# Patient Record
Sex: Female | Born: 1935 | Race: Black or African American | Hispanic: No | Marital: Married | Smoking: Former smoker
Health system: Southern US, Community
[De-identification: ages and names within clinical notes are randomized; demographics above are authoritative.]

## PROBLEM LIST (undated history)

## (undated) DIAGNOSIS — K259 Gastric ulcer, unspecified as acute or chronic, without hemorrhage or perforation: Secondary | ICD-10-CM

## (undated) DIAGNOSIS — I4891 Unspecified atrial fibrillation: Secondary | ICD-10-CM

## (undated) DIAGNOSIS — M1990 Unspecified osteoarthritis, unspecified site: Secondary | ICD-10-CM

## (undated) DIAGNOSIS — F0390 Unspecified dementia without behavioral disturbance: Secondary | ICD-10-CM

## (undated) HISTORY — PX: HIP SURGERY: SHX245

---

## 1997-06-27 ENCOUNTER — Other Ambulatory Visit: Admission: RE | Admit: 1997-06-27 | Discharge: 1997-06-27 | Payer: Self-pay | Admitting: Nephrology

## 1998-05-30 ENCOUNTER — Ambulatory Visit (HOSPITAL_COMMUNITY): Admission: RE | Admit: 1998-05-30 | Discharge: 1998-05-30 | Payer: Self-pay | Admitting: Nephrology

## 1998-05-30 ENCOUNTER — Encounter: Payer: Self-pay | Admitting: Nephrology

## 1998-06-16 ENCOUNTER — Encounter: Admission: RE | Admit: 1998-06-16 | Discharge: 1998-06-27 | Payer: Self-pay | Admitting: Orthopedic Surgery

## 1998-07-29 ENCOUNTER — Encounter: Payer: Self-pay | Admitting: Orthopedic Surgery

## 1998-07-29 ENCOUNTER — Ambulatory Visit (HOSPITAL_COMMUNITY): Admission: RE | Admit: 1998-07-29 | Discharge: 1998-07-29 | Payer: Self-pay | Admitting: Orthopedic Surgery

## 1998-09-12 ENCOUNTER — Ambulatory Visit (HOSPITAL_COMMUNITY): Admission: RE | Admit: 1998-09-12 | Discharge: 1998-09-12 | Payer: Self-pay | Admitting: Cardiology

## 1998-09-12 ENCOUNTER — Encounter: Payer: Self-pay | Admitting: Cardiology

## 1998-10-09 ENCOUNTER — Ambulatory Visit (HOSPITAL_COMMUNITY): Admission: RE | Admit: 1998-10-09 | Discharge: 1998-10-09 | Payer: Self-pay | Admitting: Cardiology

## 1999-09-29 ENCOUNTER — Encounter: Payer: Self-pay | Admitting: Orthopedic Surgery

## 1999-10-01 ENCOUNTER — Encounter: Payer: Self-pay | Admitting: Orthopedic Surgery

## 1999-10-01 ENCOUNTER — Inpatient Hospital Stay (HOSPITAL_COMMUNITY): Admission: RE | Admit: 1999-10-01 | Discharge: 1999-10-07 | Payer: Self-pay | Admitting: Orthopedic Surgery

## 2000-09-15 ENCOUNTER — Encounter: Payer: Self-pay | Admitting: Orthopedic Surgery

## 2000-09-15 ENCOUNTER — Inpatient Hospital Stay (HOSPITAL_COMMUNITY): Admission: RE | Admit: 2000-09-15 | Discharge: 2000-09-21 | Payer: Self-pay | Admitting: Orthopedic Surgery

## 2000-09-18 ENCOUNTER — Encounter: Payer: Self-pay | Admitting: Pulmonary Disease

## 2002-03-27 ENCOUNTER — Encounter: Payer: Self-pay | Admitting: Nephrology

## 2002-03-27 ENCOUNTER — Ambulatory Visit (HOSPITAL_COMMUNITY): Admission: RE | Admit: 2002-03-27 | Discharge: 2002-03-27 | Payer: Self-pay | Admitting: Nephrology

## 2002-03-29 ENCOUNTER — Encounter: Payer: Self-pay | Admitting: Nephrology

## 2002-03-29 ENCOUNTER — Ambulatory Visit (HOSPITAL_COMMUNITY): Admission: RE | Admit: 2002-03-29 | Discharge: 2002-03-29 | Payer: Self-pay | Admitting: Nephrology

## 2002-03-30 ENCOUNTER — Encounter: Payer: Self-pay | Admitting: Nephrology

## 2002-03-30 ENCOUNTER — Ambulatory Visit (HOSPITAL_COMMUNITY): Admission: RE | Admit: 2002-03-30 | Discharge: 2002-03-30 | Payer: Self-pay | Admitting: Nephrology

## 2003-05-16 ENCOUNTER — Other Ambulatory Visit: Admission: RE | Admit: 2003-05-16 | Discharge: 2003-05-16 | Payer: Self-pay | Admitting: Nephrology

## 2006-04-14 ENCOUNTER — Other Ambulatory Visit: Admission: RE | Admit: 2006-04-14 | Discharge: 2006-04-14 | Payer: Self-pay | Admitting: Nephrology

## 2008-04-29 ENCOUNTER — Encounter: Admission: RE | Admit: 2008-04-29 | Discharge: 2008-04-29 | Payer: Self-pay | Admitting: Nephrology

## 2009-06-26 ENCOUNTER — Inpatient Hospital Stay (HOSPITAL_COMMUNITY): Admission: EM | Admit: 2009-06-26 | Discharge: 2009-06-29 | Payer: Self-pay | Admitting: Emergency Medicine

## 2009-06-27 ENCOUNTER — Encounter (INDEPENDENT_AMBULATORY_CARE_PROVIDER_SITE_OTHER): Payer: Self-pay | Admitting: Nephrology

## 2009-07-04 ENCOUNTER — Emergency Department (HOSPITAL_COMMUNITY): Admission: EM | Admit: 2009-07-04 | Discharge: 2009-07-04 | Payer: Self-pay | Admitting: Emergency Medicine

## 2010-03-29 LAB — DIFFERENTIAL
Basophils Absolute: 0 10*3/uL (ref 0.0–0.1)
Basophils Absolute: 0 10*3/uL (ref 0.0–0.1)
Eosinophils Absolute: 0 10*3/uL (ref 0.0–0.7)
Eosinophils Relative: 1 % (ref 0–5)
Eosinophils Relative: 5 % (ref 0–5)
Lymphs Abs: 0.9 10*3/uL (ref 0.7–4.0)
Monocytes Absolute: 0.4 10*3/uL (ref 0.1–1.0)
Monocytes Relative: 13 % — ABNORMAL HIGH (ref 3–12)
Monocytes Relative: 8 % (ref 3–12)
Neutro Abs: 1.1 10*3/uL — ABNORMAL LOW (ref 1.7–7.7)
Neutrophils Relative %: 38 % — ABNORMAL LOW (ref 43–77)

## 2010-03-29 LAB — COMPREHENSIVE METABOLIC PANEL
AST: 33 U/L (ref 0–37)
Alkaline Phosphatase: 191 U/L — ABNORMAL HIGH (ref 39–117)
BUN: 6 mg/dL (ref 6–23)
CO2: 25 mEq/L (ref 19–32)
Calcium: 9.4 mg/dL (ref 8.4–10.5)
GFR calc Af Amer: >60 mL/min (ref >60)
Potassium: 3.3 mEq/L — ABNORMAL LOW (ref 3.5–5.1)
Total Protein: 7.3 g/dL (ref 6.0–8.3)

## 2010-03-29 LAB — POCT CARDIAC MARKERS: Troponin i, poc: <0.05 ng/mL (ref 0.00–0.09)

## 2010-03-29 LAB — URINE CULTURE

## 2010-03-29 LAB — URINALYSIS, ROUTINE W REFLEX MICROSCOPIC
Ketones, ur: 15 mg/dL — AB
Protein, ur: 30 mg/dL — AB
Specific Gravity, Urine: 1.025 (ref 1.005–1.030)
Urobilinogen, UA: 1 mg/dL (ref 0.0–1.0)

## 2010-03-29 LAB — BASIC METABOLIC PANEL
BUN: 6 mg/dL (ref 6–23)
GFR calc Af Amer: >60 mL/min (ref >60)
Glucose, Bld: 101 mg/dL — ABNORMAL HIGH (ref 70–99)
Potassium: 3.1 mEq/L — ABNORMAL LOW (ref 3.5–5.1)

## 2010-03-29 LAB — CBC
HCT: 36.6 % (ref 36.0–46.0)
Hemoglobin: 12.5 g/dL (ref 12.0–15.0)
MCHC: 33.7 g/dL (ref 30.0–36.0)
MCHC: 33.8 g/dL (ref 30.0–36.0)
MCV: 94.5 fL (ref 78.0–100.0)
Platelets: 244 10*3/uL (ref 150–400)
RBC: 4.11 MIL/uL (ref 3.87–5.11)
RDW: 13.4 % (ref 11.5–15.5)
WBC: 3.8 10*3/uL — ABNORMAL LOW (ref 4.0–10.5)

## 2010-03-29 LAB — HEPATIC FUNCTION PANEL
AST: 27 U/L (ref 0–37)
Bilirubin, Direct: 0.2 mg/dL (ref 0.0–0.3)
Total Protein: 6.9 g/dL (ref 6.0–8.3)

## 2010-03-29 LAB — RENAL FUNCTION PANEL
CO2: 24 mEq/L (ref 19–32)
Chloride: 105 mEq/L (ref 96–112)
Creatinine, Ser: 0.86 mg/dL (ref 0.4–1.2)
GFR calc Af Amer: >60 mL/min (ref >60)
GFR calc non Af Amer: >60 mL/min (ref >60)
Glucose, Bld: 117 mg/dL — ABNORMAL HIGH (ref 70–99)
Phosphorus: 4.1 mg/dL (ref 2.3–4.6)
Potassium: 3 mEq/L — ABNORMAL LOW (ref 3.5–5.1)

## 2010-03-29 LAB — LACTIC ACID, PLASMA: Lactic Acid, Venous: 2.2 mmol/L (ref 0.5–2.2)

## 2010-03-29 LAB — APTT: aPTT: 31 seconds (ref 24–37)

## 2010-03-29 LAB — URINE MICROSCOPIC-ADD ON

## 2010-03-29 LAB — CEA: CEA: 1.5 ng/mL (ref 0.0–5.0)

## 2010-03-29 LAB — LIPASE, BLOOD: Lipase: 41 U/L (ref 11–59)

## 2010-05-29 NOTE — Op Note (Signed)
[place]. [facility]  Patient:    SYBILLA, MALHOTRA Visit Number: [id number] MRN: [id number]          Service Type: SUR Location: 3700 3729 [id number] Attending Physician:  Wende Mott Dictated by:   Kennieth Rad, M.D. Proc. Date: 09/15/00 Admit Date:  09/15/2000 Discharge Date: 09/21/2000                             Operative Report  PREOPERATIVE DIAGNOSIS:  Degenerative arthritis left hip.  POSTOPERATIVE DIAGNOSIS:  Degenerative arthritis left hip.  PROCEDURE:  Left total hip replacement using DePuy Prodigy hip system.  ANESTHESIA:  General.  DESCRIPTION OF PROCEDURE:  The patient was taken to the operating room after giving adequate IV preoperative medications, given anesthesia and intubated. The patient placed in left lateral position.  Left hip was prepped with DuraPrep and draped in a sterile manner.  Bovie was used for hemostasis. Posterior silent approach was made of the left hip going through skin, subcutaneous tissue, down through the fascia lata.  The short external rotator was identified and released.  The capsule was identified and resected.  It was dislocated.  Femoral head cutting jig was put in place, and the femoral head was removed.  Attention was then turned back to the acetabulum.  Reaming of the acetabulum was then done, incising the acetabulum was done and measurement for a 28 mm x 48 mm acetabular shell Press-Fit.  Trial component was put in place and was found to be very good and stable, very good type and fit.  Next, attention was turned to the femur.  Reaming was done down the femur, followed by use of the rasp and sizing the femoral component with that of an 18 mm x 170 mm length stem component.  Copious irrigation was done with antibiotic solution.  Trial components were used and put in place.  The 28 plus 1.5 head, 48 mm x 28 mm polyethylene with a 10-degree lip, 48 mm shell, and a femoral stem 18 mm x 170 mm  length stem.  Trial components were put in place and were found to be good and stable.  Full range of motion, good stability, good leg length.  Trial components were removed and the final components were used with an addition of the apex hole eliminator and put in place into the acetabular shell.  Impaction of the stem was done down the femoral canal and placement of the acetabular components was found to be good and stable.  Again, range of motion was tested and was found to be good and stable.  Copious irrigation with antibiotic solution was then done, followed by wound close using 0-Vicryl for the fascia, 2-0 for the subcutaneous, and skin staples for the skin. Compressive dressing was applied.  The patient was placed in a hip abduction pillow and went to the recovery room in stable and satisfactory condition. Dictated by:   Kennieth Rad, M.D. Attending Physician:  Wende Mott DD:  10/18/00 TD:  10/18/00 Job: [id number]

## 2010-05-29 NOTE — H&P (Signed)
[place]. [facility]  Patient:    Alexis Gregory, Alexis Gregory Visit Number: [id number] MRN: [id number]          Service Type: SUR Location: 3700 3729 02 Attending Physician:  Wende Mott Dictated by:   Kennieth Rad, M.D. Admit Date:  09/15/2000 Discharge Date: 09/21/2000                           History and Physical  CHIEF COMPLAINT:  Painful left hip.  HISTORY OF PRESENT ILLNESS:  This is a 77 year old black female who has been followed in the office for severe degenerative joint disease involving the left hip.  The patients pain became more pronounced and aggravated with simply getting up and trying to walk.  Pain at rest as well.  The patient has been taking anti-inflammatories and pain pills.  The patient uses a cane for ambulation.  PAST MEDICAL HISTORY: 1. Right total hip arthroplasty in 2001. 2. History of hysterectomy. 3. History of appendectomy.  No history of high blood pressure or diabetes.  ALLERGIES:  E-MYCIN.  MEDICATIONS:  Celebrex, Ultracet, and Flexeril.  HABITS:  None.  FAMILY HISTORY:  Noncontributory.  REVIEW OF SYSTEMS:  Basically that of History of Present Illness.  No cardiac or respiratory, no urinary or bowel symptoms.  PHYSICAL EXAMINATION:  VITAL SIGNS:  Temperature 97.9, pulse 76, respirations 16, blood pressure 140/80.  Height 5 feet 3 inches, weight 160 pounds.  HEENT:  Normocephalic.  ______ and sclerae clear.  NECK:  Supple.  CHEST:  Clear.  CARDIAC:  S1, S2 regular.  EXTREMITIES:  Left hip markedly tender with pain anterior and laterally.  Pain on flexion and rotation of the hip.  Antalgic gait with limp on the left side. Ambulating with a cane.  LABORATORY DATA:  X-rays reveal severe degenerative joint disease involving the left hip.  IMPRESSION:  Degenerative joint disease left hip. Dictated by:   Kennieth Rad, M.D. Attending Physician:  Wende Mott DD:  10/18/00 TD:   10/18/00 Job: [id number]

## 2010-05-29 NOTE — Op Note (Signed)
[place]. [facility]  Patient:    Alexis Gregory, Alexis Gregory                    MRN: [id number] Proc. Date: 10/01/99 Adm. Date:  [id number] Disc. Date: [id number] Attending:  Wende Mott                           Operative Report  PREOPERATIVE DIAGNOSIS:  Severe degenerative joint disease, right hip, with protrusio.  POSTOPERATIVE DIAGNOSIS:  Severe degenerative joint disease, right hip, with protrusio.  PROCEDURE:  Right total hip arthroplasty with bone graft to the right acetabulum.  DESCRIPTION OF PROCEDURE:  The patient was taken to the operating room after being given preoperative medication and given general anesthesia and intubated.  The patient was placed in the right lateral position.   The right hip was prepped with Duraprep and draped in a sterile manner.  Bovie used for hemostasis.  A posterior southern approach was made over the right hip, down through the skin and subcutaneous tissue, down through the gluteal muscle. The incision was down to the fascia lata.  The hip was internally rotated. Short rotators were identified and released.  Hemostasis obtained with the Bovie.  The capsule was identified and resected.  The femoral head was identified, and the acetabulum.  The femoral head had to be removed due to the hip not being able to be dislocated.  The cutting jig was put in place for the femoral neck cut, after which the femoral head was then able to be removed. Several cystic lesions were found in the acetabulum.  These were resected with the use of curette.  Complete capsulectomy was done.  The femoral head was denuded of any cartilage and was ground for bone graft.  Reaming of the acetabulum was done up to a 48 mm shell after adequate reaming was done down to good bleeding bone and tissue.  Trial implant was put in place and was found to be a very good, snug fit, and very stable.  A 10 degree trial acetabular liner was then put in  place.  Next, attention was turned to the proximal femur.  With use of a cookie cutter, reaming was started down to the neck and femoral canal.  The femoral canal was reamed and then with use of a rasp, fitted up to a 19.5 mm stem.  The trial femoral component was put in place with a 1.5 head and neck component and was found to be good and stable _____ open with flexion, extension, internal and external rotation.  The patient also had good leg length.  Next, the trials were removed, the bone graft was impacted into the acetabulum and the defects, and with the reamer put in reverse, the graft was then packed.  The acetabular shell 48 mm was then impacted into the acetabulum, found to be very good and stable.  Two cancellous bone screws were still used, as well as the apex hole screw.  Next, the 10 degree, 28 mm liner was then snapped in place and the stem itself, femoral stem component, was 19.5, with the femoral head cobalt chrome 28 x 1.5 mm.  After impacting the femoral components and the head on, again the hip was reduced.  Very good range of motion, good length, very good stability. Copious antibiotic irrigation was done with antibiotic solution.  Wound closure was then done with the use of 0 Vicryl for  the fascia, 2-0 for the subcutaneous tissue, and skin staples on the skin.  A compressive dressing was applied.  The patient tolerated the procedure quite well.  A hip abduction pillow was put in place, and patient went to the recovery room in stable and satisfactory condition. DD:  10/20/99 TD:  10/20/99 Job: [id number]

## 2010-05-29 NOTE — Discharge Summary (Signed)
[place]. [facility]  Patient:    Alexis Gregory, Alexis Gregory Visit Number: [id number] MRN: [id number]          Service Type: SUR Location: 3700 3729 02 Attending Physician:  Wende Mott Dictated by:   Kennieth Rad, M.D. Admit Date:  09/15/2000 Discharge Date: 09/21/2000                             Discharge Summary  ADMISSION DIAGNOSIS:  Severe degenerative joint disease left hip.  DISCHARGE DIAGNOSIS:  Severe degenerative joint disease left hip.  OPERATIONS:  Left total knee arthroplasty.  COMPLICATIONS:  None.  INFECTIONS:  None.  PERTINENT HISTORY:  This is a 77 year old female with significant degenerative joint disease followed in the office with progressive pain, disability from her joint problem.  The patients pertinent physical regarded the left hip with limited range of motion, tender, with pain on ambulation as well as bed rest.  HOSPITAL COURSE:  The patient underwent preoperative medical evaluation and was found to be stable for surgery by Dr. Jeri Cos.  The patients preoperative laboratory was done and was also found to be stable.  The patient underwent the surgery, left total hip arthroplasty, on September 15, 2000. Tolerated the procedure quite well.  Postoperative course fairly benign.  The patient did have an episode of irregular heart rhythm and was seen by cardiologist and found to be an old problem.  The patient had had a history of having some arrhythmia before.  The patient was placed on some medication for cardiac arrhythmia, stabilized, and the patient progressed with physical therapy quite well with partial weightbearing on the left side.  Able to be discharged with continued Coumadin per pharmacy, Percocet for pain.  Use of walker.  Return to the office 10 days after discharge.  The patient is discharged in stable and satisfactory condition. Dictated by:   Kennieth Rad, M.D. Attending Physician:  Wende Mott DD:  10/18/00 TD:  10/18/00 Job: [id number]

## 2010-05-29 NOTE — Discharge Summary (Signed)
[place]. [facility]  Patient:    Alexis Gregory, Alexis Gregory                    MRN: [id number] Adm. Date:  [id number] Disc. Date: [id number] Attending:  Wende Mott                           Discharge Summary  ADMITTING DIAGNOSIS:  Severe degenerative arthritis, right hip, with protrusio.  DISCHARGE DIAGNOSIS:  Severe degenerative arthritis, right hip, with protrusio.  COMPLICATIONS:  None.  INFECTIONS:  None.  OPERATION:  Right total hip arthroplasty with bone grafting.  HISTORY OF PRESENT ILLNESS:  This is a 77 year old who had been followed in the office for the past two years with severe degenerative joint disease of the right hip.  The patient had undergone use of a cane and walker, with pain at rest as well as activity.  PHYSICAL EXAMINATION:  Pertinent physical was at the right hip:  Ankylotic with limited rotation as well as abduction.  Palpable and audible crepitus. Marked antalgic gait.  HOSPITAL COURSE:  The patient had preoperative laboratory done, which was found to be stable enough for the patient to undergo surgery.  The patient also had preoperative cardiac exam by Dr. Verdis Prime and medical exam per Dr. Jeri Cos.  The patient underwent right total hip arthroplasty on October 01, 1999.  She tolerated the procedure quite well.  Postoperative course was fairly benign.  The patient does live alone, so her function would have to be to the point of her being able to care for herself.  The patient did quite well in physical therapy and became stable enough to be discharged and discharged and continued on Coumadin x 2 weeks, Feosol 300 mg b.i.d., Senokot S at bedtime.  The patient is to use Ultram one to two q.4h. p.r.n. for pain and Coumadin per pharmacy and return to the office in 10 days.  DISPOSITION:  Patient discharged in stable and satisfactory condition.DD: 10/20/99 TD:  10/20/99 Job: [id number]

## 2010-05-29 NOTE — H&P (Signed)
[place]. [facility]  Patient:    Alexis Gregory, Alexis Gregory                    MRN: [id number] Adm. Date:  [id number] Disc. Date: [id number] Attending:  Wende Mott                         History and Physical  CHIEF COMPLAINT:  Painful right hip.  HISTORY OF PRESENT ILLNESS:  This is a 77 year old female who has been followed for severe degenerative joint disease involving the right hip for the past two years.  More recently the patients symptoms in the right hip have progressively gotten worse, with pain at rest as well as on ambulation.  The patient had been scheduled before but had a cardiac arrhythmia, and she had to undergo cardiac evaluation.  The patient has been using a cane and anti-inflammatories and pain medication.  PAST MEDICAL HISTORY:  Appendectomy and hysterectomy in the 1960s.  The patient does have history of degenerative arthritis, cardiac arrhythmia, tachycardia.  No history of high blood pressure or diabetes.  ALLERGIES:  ERYTHROMYCIN.  MEDICATIONS:  Toprol XL 50 mg, Celebrex, Skelaxin, vitamin E, Ultram, vitamin C 500 mg, vitamin E 400 units, calcium with vitamin D, and coated aspirin.  HABITS:  None.  FAMILY HISTORY:  Noncontributory.  REVIEW OF SYSTEMS:  No recent cardiac or respiratory or urinary symptoms or bowel symptoms.  PHYSICAL EXAMINATION:  VITAL SIGNS:  Temperature 97.2, pulse 60, respirations 18, blood pressure 120/70.  Height 5 feet 4 inches, weight 116.  GENERAL:  Alert and oriented, in no acute distress.  The patient does walk with an antalgic gait with an obvious limp on the right side.  The patient does have some leg length discrepancy with shortening on the right side.  HEENT:  Normocephalic.  Eyes:  Conjunctivae and sclerae clear.  NECK:  Supple.  CHEST:  Clear.  CARDIAC:  S1, S2, regular.  EXTREMITIES:  Right hip ankylotic with minimal rotation, hip flexion up to 90 degrees, minimal abduction of  the right hip.  Tender with palpable and audible crepitus of the joint.  IMPRESSION:  Severe degenerative joint disease, right hip, with cystic changes and protrusio. DD:  10/20/99 TD:  10/20/99 Job: [id number]

## 2010-11-12 ENCOUNTER — Ambulatory Visit
Admission: RE | Admit: 2010-11-12 | Discharge: 2010-11-12 | Disposition: A | Discharge status: Home / Self Care | Payer: PRIVATE HEALTH INSURANCE | Source: Ambulatory Visit | Attending: Nephrology | Admitting: Nephrology

## 2010-11-12 ENCOUNTER — Other Ambulatory Visit: Payer: Self-pay | Admitting: Nephrology

## 2010-11-12 DIAGNOSIS — R05 Cough: Secondary | ICD-10-CM

## 2013-03-12 ENCOUNTER — Ambulatory Visit: Payer: Self-pay | Admitting: Podiatry

## 2013-03-21 ENCOUNTER — Ambulatory Visit (INDEPENDENT_AMBULATORY_CARE_PROVIDER_SITE_OTHER): Payer: Medicare Other | Admitting: Podiatry

## 2013-03-21 ENCOUNTER — Encounter: Payer: Self-pay | Admitting: Podiatry

## 2013-03-21 VITALS — Ht 59.0 in | Wt 150.0 lb

## 2013-03-21 DIAGNOSIS — B351 Tinea unguium: Secondary | ICD-10-CM

## 2013-03-21 DIAGNOSIS — M79609 Pain in unspecified limb: Secondary | ICD-10-CM

## 2013-03-21 NOTE — Progress Notes (Signed)
   Subjective:    Patient ID: Alexis Gregory, female    DOB: 1921-05-05, 80 y.o.   MRN: [id number]  HPI Comments: "Trim her nails and some of them hurt"  Patient daughter in with pt. Needs her toenails trimmed. Some are dark and thick. Some are painful.     Review of Systems     Objective:   Physical Exam        Assessment & Plan:

## 2013-03-22 NOTE — Progress Notes (Signed)
Subjective:     Patient ID: Alexis Gregory, female   DOB: 09-Dec-1921, 80 y.o.   MRN: [id number]  HPI patient presents with nail disease 1-5 both feet that are thick and irritated   Review of Systems     Objective:   Physical Exam Thick nail disease 1-5 both feet that are painful when I palpate    Assessment:     Mycotic nail infection with pain 1-5 both feet    Plan:     Debridement painful nail bed 1-5 both feet with no bleeding noted

## 2013-03-26 ENCOUNTER — Telehealth: Payer: Self-pay | Admitting: *Deleted

## 2013-03-26 ENCOUNTER — Other Ambulatory Visit: Payer: Self-pay | Admitting: *Deleted

## 2013-03-26 MED ORDER — CEPHALEXIN 500 MG PO CAPS: 500.0000 mg | ORAL_CAPSULE | Freq: Two times a day (BID) | ORAL | Status: DC

## 2013-03-26 NOTE — Telephone Encounter (Signed)
This RN spoke with pt per her call stating onset over the weekend of " boil on upper lip of vagina ".  Boil is very sore and interferes with ADL's ( walking- etc ).  Dorann LodgeJuanita has been " sitting in hot baths and that has helped some but I was hoping I could get an antibiotic to help ".  Per call this RN informed pt above would be reviewed with AB/PA due to MD out of the office and call returned to her.

## 2013-05-15 ENCOUNTER — Other Ambulatory Visit: Payer: Self-pay | Admitting: *Deleted

## 2013-06-21 ENCOUNTER — Ambulatory Visit: Payer: Medicare Other | Admitting: Podiatry

## 2014-04-29 ENCOUNTER — Observation Stay (HOSPITAL_COMMUNITY): Payer: Medicare Other

## 2014-04-29 ENCOUNTER — Inpatient Hospital Stay (HOSPITAL_COMMUNITY): Payer: Medicare Other

## 2014-04-29 ENCOUNTER — Inpatient Hospital Stay (HOSPITAL_COMMUNITY)
Admission: EM | Admit: 2014-04-29 | Discharge: 2014-05-02 | DRG: 871 | Disposition: A | Discharge status: Home / Self Care | Payer: Medicare Other | Attending: Internal Medicine | Admitting: Internal Medicine

## 2014-04-29 ENCOUNTER — Emergency Department (HOSPITAL_COMMUNITY): Payer: Medicare Other

## 2014-04-29 ENCOUNTER — Encounter (HOSPITAL_COMMUNITY): Payer: Self-pay

## 2014-04-29 DIAGNOSIS — M1990 Unspecified osteoarthritis, unspecified site: Secondary | ICD-10-CM | POA: Diagnosis present

## 2014-04-29 DIAGNOSIS — G9341 Metabolic encephalopathy: Secondary | ICD-10-CM | POA: Diagnosis present

## 2014-04-29 DIAGNOSIS — G9340 Encephalopathy, unspecified: Secondary | ICD-10-CM | POA: Diagnosis not present

## 2014-04-29 DIAGNOSIS — I480 Paroxysmal atrial fibrillation: Secondary | ICD-10-CM | POA: Diagnosis not present

## 2014-04-29 DIAGNOSIS — R4182 Altered mental status, unspecified: Secondary | ICD-10-CM | POA: Diagnosis present

## 2014-04-29 DIAGNOSIS — D72819 Decreased white blood cell count, unspecified: Secondary | ICD-10-CM | POA: Diagnosis present

## 2014-04-29 DIAGNOSIS — F0390 Unspecified dementia without behavioral disturbance: Secondary | ICD-10-CM | POA: Diagnosis present

## 2014-04-29 DIAGNOSIS — Z79899 Other long term (current) drug therapy: Secondary | ICD-10-CM

## 2014-04-29 DIAGNOSIS — A419 Sepsis, unspecified organism: Principal | ICD-10-CM | POA: Diagnosis present

## 2014-04-29 DIAGNOSIS — N390 Urinary tract infection, site not specified: Secondary | ICD-10-CM | POA: Diagnosis present

## 2014-04-29 DIAGNOSIS — R55 Syncope and collapse: Secondary | ICD-10-CM | POA: Diagnosis present

## 2014-04-29 DIAGNOSIS — Z87891 Personal history of nicotine dependence: Secondary | ICD-10-CM | POA: Diagnosis not present

## 2014-04-29 HISTORY — DX: Unspecified osteoarthritis, unspecified site: M19.90

## 2014-04-29 HISTORY — DX: Gastric ulcer, unspecified as acute or chronic, without hemorrhage or perforation: K25.9

## 2014-04-29 HISTORY — DX: Unspecified dementia, unspecified severity, without behavioral disturbance, psychotic disturbance, mood disturbance, and anxiety: F03.90

## 2014-04-29 LAB — CBC WITH DIFFERENTIAL/PLATELET
BASOS PCT: 0 % (ref 0–1)
Basophils Absolute: 0 10*3/uL (ref 0.0–0.1)
EOS ABS: 0.1 10*3/uL (ref 0.0–0.7)
Eosinophils Relative: 3 % (ref 0–5)
HCT: 36.5 % (ref 36.0–46.0)
Hemoglobin: 11.7 g/dL — ABNORMAL LOW (ref 12.0–15.0)
LYMPHS ABS: 2 10*3/uL (ref 0.7–4.0)
Lymphocytes Relative: 59 % — ABNORMAL HIGH (ref 12–46)
MCH: 30.2 pg (ref 26.0–34.0)
MCHC: 32.1 g/dL (ref 30.0–36.0)
MCV: 94.3 fL (ref 78.0–100.0)
MONOS PCT: 7 % (ref 3–12)
Monocytes Absolute: 0.2 10*3/uL (ref 0.1–1.0)
Neutro Abs: 1 10*3/uL — ABNORMAL LOW (ref 1.7–7.7)
Neutrophils Relative %: 31 % — ABNORMAL LOW (ref 43–77)
Platelets: 217 10*3/uL (ref 150–400)
RBC: 3.87 MIL/uL (ref 3.87–5.11)
RDW: 13.3 % (ref 11.5–15.5)
WBC: 3.3 10*3/uL — AB (ref 4.0–10.5)

## 2014-04-29 LAB — URINALYSIS, ROUTINE W REFLEX MICROSCOPIC
Bilirubin Urine: NEGATIVE
Glucose, UA: NEGATIVE mg/dL
Hgb urine dipstick: NEGATIVE
KETONES UR: NEGATIVE mg/dL
NITRITE: NEGATIVE
Protein, ur: NEGATIVE mg/dL
Specific Gravity, Urine: 1.027 (ref 1.005–1.030)
UROBILINOGEN UA: 0.2 mg/dL (ref 0.0–1.0)
pH: 5 (ref 5.0–8.0)

## 2014-04-29 LAB — CBC
HCT: 33.4 % — ABNORMAL LOW (ref 36.0–46.0)
Hemoglobin: 11.1 g/dL — ABNORMAL LOW (ref 12.0–15.0)
MCH: 31.2 pg (ref 26.0–34.0)
MCHC: 33.2 g/dL (ref 30.0–36.0)
MCV: 93.8 fL (ref 78.0–100.0)
Platelets: 196 10*3/uL (ref 150–400)
RBC: 3.56 MIL/uL — ABNORMAL LOW (ref 3.87–5.11)
RDW: 13.4 % (ref 11.5–15.5)
WBC: 2.7 10*3/uL — ABNORMAL LOW (ref 4.0–10.5)

## 2014-04-29 LAB — TROPONIN I: Troponin I: <0.03 ng/mL (ref <0.031)

## 2014-04-29 LAB — COMPREHENSIVE METABOLIC PANEL
ALK PHOS: 119 U/L — AB (ref 39–117)
ALT: 13 U/L (ref 0–35)
AST: 25 U/L (ref 0–37)
Albumin: 3.9 g/dL (ref 3.5–5.2)
Anion gap: 8 (ref 5–15)
BILIRUBIN TOTAL: 0.5 mg/dL (ref 0.3–1.2)
BUN: 13 mg/dL (ref 6–23)
CALCIUM: 9 mg/dL (ref 8.4–10.5)
CO2: 25 mmol/L (ref 19–32)
Chloride: 105 mmol/L (ref 96–112)
Creatinine, Ser: 0.83 mg/dL (ref 0.50–1.10)
GFR calc non Af Amer: 60 mL/min — ABNORMAL LOW (ref >90)
GFR, EST AFRICAN AMERICAN: 70 mL/min — AB (ref >90)
Glucose, Bld: 107 mg/dL — ABNORMAL HIGH (ref 70–99)
POTASSIUM: 3.6 mmol/L (ref 3.5–5.1)
Sodium: 138 mmol/L (ref 135–145)
Total Protein: 7.4 g/dL (ref 6.0–8.3)

## 2014-04-29 LAB — CREATININE, SERUM
CREATININE: 0.75 mg/dL (ref 0.50–1.10)
GFR calc Af Amer: 84 mL/min — ABNORMAL LOW (ref >90)
GFR calc non Af Amer: 72 mL/min — ABNORMAL LOW (ref >90)

## 2014-04-29 LAB — CBG MONITORING, ED: Glucose-Capillary: 107 mg/dL — ABNORMAL HIGH (ref 70–99)

## 2014-04-29 LAB — URINE MICROSCOPIC-ADD ON

## 2014-04-29 LAB — POC OCCULT BLOOD, ED: FECAL OCCULT BLD: NEGATIVE

## 2014-04-29 LAB — I-STAT CG4 LACTIC ACID, ED: Lactic Acid, Venous: 0.65 mmol/L (ref 0.5–2.0)

## 2014-04-29 MED ORDER — VITAMIN D 1000 UNITS PO TABS
1000.0000 [IU] | ORAL_TABLET | Freq: Every day | ORAL | Status: DC
  Administered 2014-04-30 – 2014-05-02 (×3): 1000 [IU] via ORAL
  Filled 2014-04-29 (×3): qty 1

## 2014-04-29 MED ORDER — SODIUM CHLORIDE 0.9 % IV SOLN: INTRAVENOUS | Status: AC

## 2014-04-29 MED ORDER — SODIUM CHLORIDE 0.9 % IV BOLUS (SEPSIS)
500.0000 mL | Freq: Once | INTRAVENOUS | Status: AC
  Administered 2014-04-29: 500 mL via INTRAVENOUS

## 2014-04-29 MED ORDER — DEXTROSE 5 % IV SOLN
1.0000 g | INTRAVENOUS | Status: DC
  Administered 2014-04-30 – 2014-05-02 (×3): 1 g via INTRAVENOUS
  Filled 2014-04-29 (×3): qty 10

## 2014-04-29 MED ORDER — ALBUTEROL SULFATE (2.5 MG/3ML) 0.083% IN NEBU: 2.5000 mg | INHALATION_SOLUTION | RESPIRATORY_TRACT | Status: DC | PRN

## 2014-04-29 MED ORDER — SODIUM CHLORIDE 0.9 % IV BOLUS (SEPSIS): 2000.0000 mL | Freq: Once | INTRAVENOUS | Status: DC

## 2014-04-29 MED ORDER — DEXTROSE 5 % IV SOLN
1.0000 g | Freq: Once | INTRAVENOUS | Status: AC
  Administered 2014-04-29: 1 g via INTRAVENOUS
  Filled 2014-04-29: qty 10

## 2014-04-29 MED ORDER — MEMANTINE HCL 10 MG PO TABS
10.0000 mg | ORAL_TABLET | Freq: Every day | ORAL | Status: DC
  Administered 2014-04-30 – 2014-05-02 (×3): 10 mg via ORAL
  Filled 2014-04-29 (×3): qty 1

## 2014-04-29 MED ORDER — HEPARIN SODIUM (PORCINE) 5000 UNIT/ML IJ SOLN
5000.0000 [IU] | Freq: Three times a day (TID) | INTRAMUSCULAR | Status: DC
  Administered 2014-04-29 – 2014-05-02 (×8): 5000 [IU] via SUBCUTANEOUS
  Filled 2014-04-29 (×8): qty 1

## 2014-04-29 MED ORDER — ACETAMINOPHEN 325 MG PO TABS: 650.0000 mg | ORAL_TABLET | Freq: Four times a day (QID) | ORAL | Status: DC | PRN

## 2014-04-29 MED ORDER — ACETAMINOPHEN 650 MG RE SUPP: 650.0000 mg | Freq: Four times a day (QID) | RECTAL | Status: DC | PRN

## 2014-04-29 MED ORDER — ONDANSETRON HCL 4 MG/2ML IJ SOLN: 4.0000 mg | Freq: Four times a day (QID) | INTRAMUSCULAR | Status: DC | PRN

## 2014-04-29 MED ORDER — ONDANSETRON HCL 4 MG PO TABS: 4.0000 mg | ORAL_TABLET | Freq: Four times a day (QID) | ORAL | Status: DC | PRN

## 2014-04-29 NOTE — ED Notes (Signed)
Bed: WA06 Expected date:  Expected time:  Means of arrival:  Comments: EMS- elderly, weakness

## 2014-04-29 NOTE — H&P (Signed)
Patient Demographics  Alexis Gregory, is a 81 y.o. female  MRN: [id number]   DOB - Jun 30, 1921  Admit Date - 04/29/2014  Outpatient Primary MD for the patient is Gwynneth Aliment, MD   With History of -  Past Medical History  Diagnosis Date  . Dementia   . Gastric ulcer   . Arthritis       Past Surgical History  Procedure Laterality Date  . Hip surgery      rt/left    in for   Chief Complaint  Patient presents with  . Loss of Consciousness     HPI  Alexis Gregory  is a 81 y.o. female, with past medical history of advanced dementia, lives with her daughter, baseline confused, ambulatory with a cane, communicative, brought by her daughter today for episodes of syncope, patient reports her mom has not been feeling well over the last few hours, with decreased appetite, more lethargy, she noticed her having an episode of 3-4 minutes of unresponsiveness while she was sitting on a chair, she slumped backwards, was unresponsive, then she regained consciousness, but remains confused, in ED patient was noticed to be mildly hypothermic at 96.4, with white blood cell count of 3.3, workup was significant for UTI, patient was started on Rocephin, patient can't provide any review of systems or history, was obtained from her daughter, daughter did not noticed any facial droop, or any focal deficits, denies her mom having any fever or chills, nausea or vomiting, no diarrhea.    Review of Systems    Can't obtain any review of systems given patient's altered mental status.   Social History History  Substance Use Topics  . Smoking status: Former Games developer  . Smokeless tobacco: Not on file  . Alcohol Use: No     Family History Significant for hypertension in daughter.  Prior to Admission medications   Medication Sig Start Date End Date Taking? Authorizing Provider  acetaminophen (TYLENOL) 650 MG CR tablet Take 650 mg by mouth daily as needed for pain.   Yes Historical Provider,  MD  cholecalciferol (VITAMIN D) 1000 UNITS tablet Take 1,000 Units by mouth daily.   Yes Historical Provider, MD  memantine (NAMENDA) 10 MG tablet Take 10 mg by mouth daily. 03/08/14  Yes Historical Provider, MD  traMADol (ULTRAM) 50 MG tablet Take 50 mg by mouth daily as needed for moderate pain.  03/26/14  Yes Historical Provider, MD    No Known Allergies  Physical Exam  Vitals  Blood pressure 131/70, pulse 86, temperature 96.4 F (35.8 C), temperature source Rectal, resp. rate 20, height  (1.549 m), weight 70.308 kg (155 lb), SpO2 100 %.   1. General elderly female lying in bed in NAD,    2. Patient is confused, not answering question, respond to loud verbal stimuli, does not follow command.  3. Unable to perform focused physical exam given patient noncompliance, appears to be moving all her extremities.  4. Ears and Eyes appear Normal, Conjunctivae clear, PERRLA. Moist Oral Mucosa.  5. Supple Neck, No JVD, No cervical lymphadenopathy appriciated, No Carotid Bruits.  6. Symmetrical Chest wall movement, Good air movement bilaterally, CTAB.  7. RRR, No Gallops, Rubs or Murmurs, No Parasternal Heave.  8. Positive Bowel Sounds, Abdomen Soft, No tenderness, No organomegaly appriciated,No rebound -guarding or rigidity.  9.  No Cyanosis, Normal Skin Turgor, No Skin Rash or Bruise.  10.   joints appear normal , no effusions, Normal ROM. Mild pitting edema bilaterally  11. No Palpable Lymph Nodes in Neck or Axillae    Data Review  CBC  Recent Labs Lab 04/29/14 1350  WBC 3.3*  HGB 11.7*  HCT 36.5  PLT 217  MCV 94.3  MCH 30.2  MCHC 32.1  RDW 13.3  LYMPHSABS 2.0  MONOABS 0.2  EOSABS 0.1  BASOSABS 0.0   ------------------------------------------------------------------------------------------------------------------  Chemistries   Recent Labs Lab 04/29/14 1350  NA 138  K 3.6  CL 105  CO2 25  GLUCOSE 107*  BUN 13  CREATININE 0.83  CALCIUM 9.0  AST 25   ALT 13  ALKPHOS 119*  BILITOT 0.5   ------------------------------------------------------------------------------------------------------------------ estimated creatinine clearance is 40.4 mL/min (by C-G formula based on Cr of 0.83). ------------------------------------------------------------------------------------------------------------------ No results for input(s): TSH, T4TOTAL, T3FREE, THYROIDAB in the last 72 hours.  Invalid input(s): FREET3   Coagulation profile No results for input(s): INR, PROTIME in the last 168 hours. ------------------------------------------------------------------------------------------------------------------- No results for input(s): DDIMER in the last 72 hours. -------------------------------------------------------------------------------------------------------------------  Cardiac Enzymes No results for input(s): CKMB, TROPONINI, MYOGLOBIN in the last 168 hours.  Invalid input(s): CK ------------------------------------------------------------------------------------------------------------------ Invalid input(s): POCBNP   ---------------------------------------------------------------------------------------------------------------  Urinalysis    Component Value Date/Time   COLORURINE YELLOW 04/29/2014 1439   APPEARANCEUR CLOUDY* 04/29/2014 1439   LABSPEC 1.027 04/29/2014 1439   PHURINE 5.0 04/29/2014 1439   GLUCOSEU NEGATIVE 04/29/2014 1439   HGBUR NEGATIVE 04/29/2014 1439   BILIRUBINUR NEGATIVE 04/29/2014 1439   KETONESUR NEGATIVE 04/29/2014 1439   PROTEINUR NEGATIVE 04/29/2014 1439   UROBILINOGEN 0.2 04/29/2014 1439   NITRITE NEGATIVE 04/29/2014 1439   LEUKOCYTESUR MODERATE* 04/29/2014 1439    ----------------------------------------------------------------------------------------------------------------  Imaging results:   Dg Chest Port 1 View  04/29/2014   CLINICAL DATA:  Shortness of breath, altered mental status.   EXAM: PORTABLE CHEST - 1 VIEW  COMPARISON:  November 12, 2010.  FINDINGS: Stable cardiomediastinal silhouette. Hypoinflation of the lungs is noted. No pneumothorax or significant pleural effusion is noted. Degenerative change of the right glenohumeral joint is noted. Mild left basilar subsegmental atelectasis is noted. No significant abnormality seen in the right lung.  IMPRESSION: Hypoinflation of the lungs is noted. Mild left basilar subsegmental atelectasis is noted.   Electronically Signed   By: Lupita Raider, M.D.   On: 04/29/2014 14:24        Assessment & Plan  Active Problems:   Syncope   Acute encephalopathy   Dementia   Syncope/acute encephalopathy - This appears to be more in the setting of metabolic encephalopathy, most likely related to UTI infection, but given her presentation of cellulitis of consciousness, will admit to telemetry unit, will cycle cardiac enzymes, will check 2-D echo, will check CT head without contrast. - We'll keep nothing by mouth pending SLP evaluation. - We'll keep on gentle hydration - We will treat her UTI - We'll consult PT  UTI - Continue we'll start on Rocephin, follow on urine culture.  Dementia - Continue with Namenda    DVT Prophylaxis Heparin -   AM Labs Ordered, also please review Full Orders  Family Communication: Admission, patients condition and plan of care including tests being ordered have been discussed with the daughter who indicate understanding and agree with the plan and Code Status.  Code Status Full  Likely DC to  home when stable, will consult PT  Condition GUARDED    Time spent in minutes : 50 minutes    ELGERGAWY, DAWOOD M.D on 04/29/2014 at 4:13 PM  Between 7am to 7pm - Pager -  [phone]  After 7pm go to [email or web address] - password [email or web address]  And look for the night coverage person covering me after hours  [facility] Office  [phone]   **Disclaimer: This note may have been dictated with  voice recognition software. Similar sounding words can inadvertently be transcribed and this note may contain transcription errors which may not have been corrected upon publication of note.**

## 2014-04-29 NOTE — ED Notes (Signed)
MD at bedside. EDP J AT [facility] CANCEL OF TRANSFER TO [facility]. PT WILL STAY TO [place]. CHANGE IN BED ASSIGNMENT. LISA UNIT SECT MADE AWARE. PT AND FAMILY MADE AWARE. ADMITTING MD TO MAKE THIS CHANGE. CAKELINK CALLED AND CANCELLED

## 2014-04-29 NOTE — ED Provider Notes (Signed)
CSN: [id number]     Arrival date & time 04/29/14  1258 History   First MD Initiated Contact with Patient 04/29/14 1322     Chief Complaint  Patient presents with  . Loss of Consciousness     (Consider location/radiation/quality/duration/timing/severity/associated sxs/prior Treatment) HPI Level V caveat dementia history is obtained from patient's daughter. Patient was sitting in her home 11:50 AM today when she suffered loss of consciousness lasting several seconds. With diminished breathing. EMS was called. No treatment prior to coming here. Patient regained consciousness spontaneously. She is presently asymptomatic except "feels sleepy" no other associated symptoms. Past Medical History  Diagnosis Date  . Dementia    No past surgical history on file. No family history on file. History  Substance Use Topics  . Smoking status: Unknown If Ever Smoked  . Smokeless tobacco: Not on file  . Alcohol Use: Not on file   OB History    No data available     Review of Systems  Unable to perform ROS  unable to perform complete review of systems patient demented     Allergies  Review of patient's allergies indicates no known allergies.  Home Medications   Prior to Admission medications   Medication Sig Start Date End Date Taking? Authorizing Provider  NON FORMULARY Equate sleep aid and Tylenol    Historical Provider, MD   SpO2 95% Physical Exam  Constitutional: She appears well-developed and well-nourished. No distress.  HENT:  Head: Normocephalic and atraumatic.  Opens eyes to verbal stimulus  Eyes: Conjunctivae are normal. Pupils are equal, round, and reactive to light.  Pupils 3 mm bilaterally reactive to light  Neck: Neck supple. No tracheal deviation present. No thyromegaly present.  Cardiovascular: Normal rate and regular rhythm.   No murmur heard. Pulmonary/Chest: Effort normal and breath sounds normal.  Abdominal: Soft. Bowel sounds are normal. She exhibits no  distension. There is no tenderness.  Genitourinary: Guaiac negative stool.  Normal tone brown stool Hemoccult negative  Musculoskeletal: Normal range of motion. She exhibits no edema or tenderness.  Neurological: Coordination normal.  Sleepy arousable to verbal stimulus and moves all extremities, does not follow some commands. DTRs symmetric bilaterally at knee jerk ankle jerk biceps. Toes downgoing bilaterally  Skin: Skin is warm and dry. No rash noted.  Psychiatric: She has a normal mood and affect.  Nursing note and vitals reviewed.   ED Course  Procedures (including critical care time) Labs Review Labs Reviewed - No data to display  Imaging Review No results found.   EKG Interpretation   Date/Time:  Monday April 29 2014 13:14:05 EDT Ventricular Rate:  60 PR Interval:  206 QRS Duration: 100 QT Interval:  479 QTC Calculation: 479 R Axis:   -8 Text Interpretation:  Minimal ST elevation, inferior leads Confirmed by  Ethelda Chick  MD, Yazaira Speas [phone]) on 04/29/2014 2:06:50 PM     Results for orders placed or performed during the hospital encounter of 04/29/14  Urinalysis, Routine w reflex microscopic  Result Value Ref Range   Color, Urine YELLOW YELLOW   APPearance CLOUDY (A) CLEAR   Specific Gravity, Urine 1.027 1.005 - 1.030   pH 5.0 5.0 - 8.0   Glucose, UA NEGATIVE NEGATIVE mg/dL   Hgb urine dipstick NEGATIVE NEGATIVE   Bilirubin Urine NEGATIVE NEGATIVE   Ketones, ur NEGATIVE NEGATIVE mg/dL   Protein, ur NEGATIVE NEGATIVE mg/dL   Urobilinogen, UA 0.2 0.0 - 1.0 mg/dL   Nitrite NEGATIVE NEGATIVE   Leukocytes, UA MODERATE (A) NEGATIVE  Comprehensive metabolic panel  Result Value Ref Range   Sodium 138 135 - 145 mmol/L   Potassium 3.6 3.5 - 5.1 mmol/L   Chloride 105 96 - 112 mmol/L   CO2 25 19 - 32 mmol/L   Glucose, Bld 107 (H) 70 - 99 mg/dL   BUN 13 6 - 23 mg/dL   Creatinine, Ser 0.860.83 0.50 - 1.10 mg/dL   Calcium 9.0 8.4 - 57.810.5 mg/dL   Total Protein 7.4 6.0 - 8.3 g/dL    Albumin 3.9 3.5 - 5.2 g/dL   AST 25 0 - 37 U/L   ALT 13 0 - 35 U/L   Alkaline Phosphatase 119 (H) 39 - 117 U/L   Total Bilirubin 0.5 0.3 - 1.2 mg/dL   GFR calc non Af Amer 60 (L) >90 mL/min   GFR calc Af Amer 70 (L) >90 mL/min   Anion gap 8 5 - 15  CBC with Differential/Platelet  Result Value Ref Range   WBC 3.3 (L) 4.0 - 10.5 K/uL   RBC 3.87 3.87 - 5.11 MIL/uL   Hemoglobin 11.7 (L) 12.0 - 15.0 g/dL   HCT 46.936.5 62.936.0 - 52.846.0 %   MCV 94.3 78.0 - 100.0 fL   MCH 30.2 26.0 - 34.0 pg   MCHC 32.1 30.0 - 36.0 g/dL   RDW 41.313.3 24.411.5 - 01.015.5 %   Platelets 217 150 - 400 K/uL   Neutrophils Relative % 31 (L) 43 - 77 %   Neutro Abs 1.0 (L) 1.7 - 7.7 K/uL   Lymphocytes Relative 59 (H) 12 - 46 %   Lymphs Abs 2.0 0.7 - 4.0 K/uL   Monocytes Relative 7 3 - 12 %   Monocytes Absolute 0.2 0.1 - 1.0 K/uL   Eosinophils Relative 3 0 - 5 %   Eosinophils Absolute 0.1 0.0 - 0.7 K/uL   Basophils Relative 0 0 - 1 %   Basophils Absolute 0.0 0.0 - 0.1 K/uL  Urine microscopic-add on  Result Value Ref Range   Squamous Epithelial / LPF RARE RARE   WBC, UA 21-50 <3 WBC/hpf   Bacteria, UA FEW (A) RARE   Casts HYALINE CASTS (A) NEGATIVE  CBG monitoring, ED  Result Value Ref Range   Glucose-Capillary 107 (H) 70 - 99 mg/dL   Comment 1 Notify RN   POC occult blood, ED  Result Value Ref Range   Fecal Occult Bld NEGATIVE NEGATIVE   Dg Chest Port 1 View  04/29/2014   CLINICAL DATA:  Shortness of breath, altered mental status.  EXAM: PORTABLE CHEST - 1 VIEW  COMPARISON:  November 12, 2010.  FINDINGS: Stable cardiomediastinal silhouette. Hypoinflation of the lungs is noted. No pneumothorax or significant pleural effusion is noted. Degenerative change of the right glenohumeral joint is noted. Mild left basilar subsegmental atelectasis is noted. No significant abnormality seen in the right lung.  IMPRESSION: Hypoinflation of the lungs is noted. Mild left basilar subsegmental atelectasis is noted.   Electronically Signed    By: Lupita RaiderJames  Green Jr, M.D.   On: 04/29/2014 14:24    MDM  Spoke with Dr. Randol KernElgergawy arrange for inpatient stay blood cultures  And urine culture obtained in light of mild hypothermia. Iv fluiod bolus, lactate ordered in light of suspected sepsis Final diagnoses:  None   diagnosis #1syncope #2uti #3 anemia      Doug SouSam Itzae Miralles, MD 04/29/14 [phone]

## 2014-04-29 NOTE — ED Notes (Signed)
MD at bedside. 

## 2014-04-29 NOTE — ED Notes (Signed)
Patient transported to CT 

## 2014-04-29 NOTE — ED Notes (Signed)
MD at bedside. BACK TO SPEAK WITH FAMILY. Made aware of admission

## 2014-04-29 NOTE — ED Notes (Addendum)
EDP J PERFORMED OCCULT BLOOD STOOL AND PERFORMED LAB TEST

## 2014-04-29 NOTE — ED Notes (Signed)
PT NOT ABLE TO PERFORM CT- CT CALLED ADMITTING MD

## 2014-04-29 NOTE — ED Notes (Signed)
Per [facility]- daughter called out pt had syncopal episode lasting 3-4 minutes with apnea. Upon arrival of EMS pt responding to baseline per daughter slightly lethargic. Pt denies any complaints. Pt during syncopal episode reclined to a chair. No trauma. NEG FOR STROKE

## 2014-04-30 DIAGNOSIS — D72819 Decreased white blood cell count, unspecified: Secondary | ICD-10-CM

## 2014-04-30 DIAGNOSIS — R55 Syncope and collapse: Secondary | ICD-10-CM

## 2014-04-30 LAB — COMPREHENSIVE METABOLIC PANEL
ALT: 12 U/L (ref 0–35)
AST: 25 U/L (ref 0–37)
Albumin: 3.5 g/dL (ref 3.5–5.2)
Alkaline Phosphatase: 114 U/L (ref 39–117)
Anion gap: 7 (ref 5–15)
BUN: 10 mg/dL (ref 6–23)
CALCIUM: 8.8 mg/dL (ref 8.4–10.5)
CO2: 25 mmol/L (ref 19–32)
Chloride: 107 mmol/L (ref 96–112)
Creatinine, Ser: 0.75 mg/dL (ref 0.50–1.10)
GFR calc non Af Amer: 72 mL/min — ABNORMAL LOW (ref >90)
GFR, EST AFRICAN AMERICAN: 84 mL/min — AB (ref >90)
GLUCOSE: 91 mg/dL (ref 70–99)
Potassium: 3.7 mmol/L (ref 3.5–5.1)
SODIUM: 139 mmol/L (ref 135–145)
Total Bilirubin: 0.6 mg/dL (ref 0.3–1.2)
Total Protein: 7.2 g/dL (ref 6.0–8.3)

## 2014-04-30 LAB — TROPONIN I: Troponin I: <0.03 ng/mL (ref <0.031)

## 2014-04-30 MED ORDER — SODIUM CHLORIDE 0.9 % IV SOLN
INTRAVENOUS | Status: AC
  Administered 2014-04-30 – 2014-05-01 (×2): via INTRAVENOUS

## 2014-04-30 NOTE — Evaluation (Signed)
Physical Therapy Evaluation Patient Details Name: Alexis Gregory MRN: [id number] DOB: 1921/10/12 Today's Date: 04/30/2014   History of Present Illness  81 yo female admitted with syncope. Hx of dementia. Pt is from home-lives with daughter  Clinical Impression  On eval, pt required Min assist for mobility-able to stand and pivot to recliner with MAX encouragement from daughter, nursing, and therapist. Unable to get pt to ambulate. Discussed d/c plan-daughter present states plan is for home.     Follow Up Recommendations Home health PT;Supervision/Assistance - 24 hour    Equipment Recommendations  None recommended by PT    Recommendations for Other Services       Precautions / Restrictions Precautions Precautions: Fall Restrictions Weight Bearing Restrictions: No      Mobility  Bed Mobility Overal bed mobility: Needs Assistance Bed Mobility: Supine to Sit     Supine to sit: Min assist     General bed mobility comments: Increased time. Assist for trunk. Mod encouragement  Transfers Overall transfer level: Needs assistance Equipment used: Rolling walker (2 wheeled) Transfers: Sit to/from [employer] CorporationStand;Stand Pivot Transfers Sit to Stand: Mod assist Stand pivot transfers: Min assist       General transfer comment: max encouragement from daugher, nursing and therapist for pt participation. Used walker for safety initially with standing  however pt did not really hold onto walker for pivot to recliner  Ambulation/Gait             General Gait Details: NT-pt would not walk  Stairs            Wheelchair Mobility    Modified Rankin (Stroke Patients Only)       Balance                                             Pertinent Vitals/Pain Pain Assessment: No/denies pain    Home Living Family/patient expects to be discharged to:: Private residence Living Arrangements: Children Available Help at Discharge: Available 24 hours/day Type of  Home: House         Home Equipment: Gilmer MorCane - single point      Prior Function Level of Independence: Needs assistance   Gait / Transfers Assistance Needed: uses cane for ambulation           Hand Dominance        Extremity/Trunk Assessment   Upper Extremity Assessment: Generalized weakness           Lower Extremity Assessment: Generalized weakness      Cervical / Trunk Assessment: Kyphotic  Communication   Communication: HOH  Cognition Arousal/Alertness: Awake/alert Behavior During Therapy: WFL for tasks assessed/performed Overall Cognitive Status: History of cognitive impairments - at baseline                      General Comments      Exercises        Assessment/Plan    PT Assessment Patient needs continued PT services  PT Diagnosis Altered mental status;Difficulty walking   PT Problem List Decreased mobility;Decreased cognition  PT Treatment Interventions DME instruction;Gait training;Functional mobility training;Therapeutic activities;Therapeutic exercise;Patient/family education   PT Goals (Current goals can be found in the Care Plan section) Acute Rehab PT Goals Patient Stated Goal: none stated by pt. daughter present-states plan is for home PT Goal Formulation: With family Time For Goal Achievement: 05/14/14 Potential to  Achieve Goals: Fair    Frequency Min 3X/week   Barriers to discharge        Co-evaluation               End of Session   Activity Tolerance: Patient tolerated treatment well Patient left: in chair;with call bell/phone within reach;with chair alarm set;with family/visitor present           Time: 7829-5621 PT Time Calculation (min) (ACUTE ONLY): 18 min   Charges:   PT Evaluation $Initial PT Evaluation Tier I: 1 Procedure     PT G Codes:        Rebeca Alert, MPT Pager: [phone]

## 2014-04-30 NOTE — Evaluation (Signed)
Clinical/Bedside Swallow Evaluation Patient Details  Name: Alexis Gregory MRN: [id number] Date of Birth: 01-31-1921  Today's Date: 04/30/2014 Time: SLP Start Time (ACUTE ONLY): 1200 SLP Stop Time (ACUTE ONLY): 1230 SLP Time Calculation (min) (ACUTE ONLY): 30 min  Past Medical History:  Past Medical History  Diagnosis Date  . Dementia   . Gastric ulcer   . Arthritis    Past Surgical History:  Past Surgical History  Procedure Laterality Date  . Hip surgery      rt/left   HPI:  81 [facility]  Pt PMH + for gastric ulcer, dementia, arthritis.  Pt failed RNSSS and swallow eval ordered.     Assessment / Plan / Recommendation Clinical Impression  Pt presents with no focal CN deficits of components pt would complete of OME.  She self fed crackers, applesauce, icecream, orange juice and water.  Mildly slow mastication of cracker noted and pt with throat clear immediate post swallow x2.  Daughter reports throat clearing present with intake x2 weeks.  CXR negative and pt is afebrile, therefore suspect she is protecting her airway.  Recommend regular/thin diet with general precautions.  Pt does have large pills cut in half at home per daughter with whom she lives. Using teach back, educated daughter to findings.  Pt unable to recall new information at this time.  Given new throat clearing symptoms, SLP will follow up x1 to assure pt protecting her airway with po.      Aspiration Risk  Mild    Diet Recommendation Regular;Thin liquid   Liquid Administration via: Cup;Straw Medication Administration: Whole meds with liquid Supervision: Patient able to self feed Compensations: Slow rate;Small sips/bites Postural Changes and/or Swallow Maneuvers: Seated upright 90 degrees;Upright 30-60 min after meal    Other  Recommendations Oral Care Recommendations: Oral care BID   Follow Up Recommendations    TBD   Frequency and Duration min 1 x/week  1 week   Pertinent  Vitals/Pain Afebrile, decreased     Swallow Study Prior Functional Status  Type of Home: House Available Help at Discharge: Available 24 hours/day    General Date of Onset: 04/30/14 HPI: 81 [facility].  Pt PMH + for gastric ulcer.  Pt failed RNSSS and swallow eval ordered.   Type of Study: Bedside swallow evaluation Diet Prior to this Study: NPO Respiratory Status: Room air History of Recent Intubation: No Behavior/Cognition: Alert;Doesn't follow directions;Decreased sustained attention Oral Cavity - Dentition: Dentures, top;Dentures, bottom Self-Feeding Abilities: Able to feed self;Needs set up Patient Positioning: Upright in chair Baseline Vocal Quality: Clear Volitional Cough: Cognitively unable to elicit Volitional Swallow: Unable to elicit    Oral/Motor/Sensory Function Overall Oral Motor/Sensory Function:  (no focal cn deficits from evaluation components pt would complete)   Ice Chips Ice chips: Not tested   Thin Liquid Thin Liquid: Impaired Presentation: Cup;Self Fed;Straw Pharyngeal  Phase Impairments: Throat Clearing - Immediate    Nectar Thick Nectar Thick Liquid: Not tested   Honey Thick Honey Thick Liquid: Not tested   Puree Puree: Impaired Presentation: Self Fed;Spoon Pharyngeal Phase Impairments: Throat Clearing - Immediate   Solid   GO    Solid: Impaired Presentation: Self Fed Oral Phase Impairments: Impaired anterior to posterior transit;Reduced lingual movement/coordination Oral Phase Functional Implications:  (slow mastication) Pharyngeal Phase Impairments: Suspected delayed [name]       [name], MS [facility] SLP [phone]

## 2014-04-30 NOTE — Progress Notes (Signed)
  Echocardiogram 2D Echocardiogram has been performed.  Kamren Heskett FRANCES 04/30/2014, 2:38 PM

## 2014-04-30 NOTE — Progress Notes (Signed)
Patient ID: Alexis Gregory, female   DOB: [birth year], 81 y.o.   MRN: [id number] [facility] PROGRESS NOTE  Alexis Gregory KPT:[id number] DOB: [birth year] DOA: 04/29/2014 PCP: Maximino Greenland, MD  Brief narrative:    81 y.o. female with past medical history of advanced dementia, lives with her daughter, ambulates with cane at baseline. She presented to [facility] ED 04/29/14 because of brief episode of unresponsiveness while sitting in a chair. Per patient's daughter patient has had poor appetite and more confused over past few days and has passed out for few minutes 1 day PTA. No fevers. No facial drop, no extremity weakenss or paralysis. No fall.  On admission, pt was mildly hypothermic at 96.4 F, RR 20, hypotension. WBC count was 3.3. CXR showed mild left basilar atelectasis. UA revealed moderate leukocytes and WBC 21-50. She was started on rocephin for treatment of UTI.  Barrier to discharge:Patietn receiving IV rocehin for UTI. She need PT evaluation for safe discharge plan. PT eval pending as of today.   Assessment/Plan:    Principal Problem:   Acute encephalopathy / Advanced dementia without behavioral disturbance  - Likely due to combination of advanced dementia and UTI - Per pt daughter mental status better today - Continue treatment for UTI - Obtain PT, nutrition and SLP - Continue supportive care with IV fluids for next 24 hours until PO intake better.  Active Problems:   Sepsis secondary to UTI (lower urinary tract infection) / Leukopenia - Sepsis criteria met on admission with hypothermia, tachypnea, hypotension of 89/69 and leukopenia. Source of infection UTI based on urinalysis - Started on IV rocephin - Follow up urine culture results. Blood culture results negative so far. - Patient's daughter did not want aggressive work up for her mother considering her age and dementia.    Syncope - Unclear etiology, per patient's daughter pt did have true syncope - I spoke with  patient;s daughter who said she did not want her mother to have aggressive interventions. I have reviewed 12 lead EKG findings on admission and informed her of ST changes and what it could mean. She said she does not want her mother to undergo procedure, she only is ok with blood work, aspirin at best for secondary stroke prophylaxis.  - Troponin level WNL.  - PT eval pending.     DVT Prophylaxis  - Heparin subQ ordered    Code Status: Full.  Family Communication:  Family not at the bedside this am; spoke with patient's daughter over the phone 04/30/2014 Disposition Plan: Pt needs PT evaluation for safe discharge plan. She is on IV abx for UTI. Not stable for discharge today.   IV access:  Peripheral IV  Procedures and diagnostic studies:    Dg Chest Port 1 View  04/29/2014    Hypoinflation of the lungs is noted. Mild left basilar subsegmental atelectasis is noted.   Electronically Signed   By: Marijo Conception, M.D.   On: 04/29/2014 14:24   Medical Consultants:  None   Other Consultants:  Physical therapy Nutrition SLP  IAnti-Infectives:   Rocephin 04/29/2014 -->   Leisa Lenz, MD  [facility] Pager [phone]  Time spent in minutes: 25 minutes  If 7PM-7AM, please contact night-coverage [email or web address] Password [email or web address] 04/30/2014, 2:48 PM   LOS: 1 day    HPI/Subjective: No acute overnight events. Patient reports nausea but no vomiting. She is awake but confused, has dementia.   Objective: Filed Vitals:   04/29/14 1743 04/29/14 2235 04/30/14 0018 04/30/14  0430  BP: 106/60 89/69 140/85 130/86  Pulse: 80 66  79  Temp:  97.7 F (36.5 C)  97.8 F (36.6 C)  TempSrc:  Axillary  Oral  Resp:  18  20  Height:      Weight:      SpO2: 99% 100%  99%    Intake/Output Summary (Last 24 hours) at 04/30/14 1448 Last data filed at 04/30/14 0603  Gross per 24 hour  Intake    350 ml  Output      0 ml  Net    350 ml    Exam:   General:  Pt is alert, not in acute  distress  Cardiovascular: Regular rate and rhythm, S1/S2 (+)  Respiratory: no wheezing, no crackles, no rhonchi  Abdomen: Soft, non tender, non distended, bowel sounds present  Extremities: No edema, pulses DP and PT palpable bilaterally  Neuro: Monfocal  Data Reviewed: Basic Metabolic Panel:  Recent Labs Lab 04/29/14 1350 04/29/14 1942 04/30/14 0646  NA 138  --  139  K 3.6  --  3.7  CL 105  --  107  CO2 25  --  25  GLUCOSE 107*  --  91  BUN 13  --  10  CREATININE 0.83 0.75 0.75  CALCIUM 9.0  --  8.8   Liver Function Tests:  Recent Labs Lab 04/29/14 1350 04/30/14 0646  AST 25 25  ALT 13 12  ALKPHOS 119* 114  BILITOT 0.5 0.6  PROT 7.4 7.2  ALBUMIN 3.9 3.5   No results for input(s): LIPASE, AMYLASE in the last 168 hours. No results for input(s): AMMONIA in the last 168 hours. CBC:  Recent Labs Lab 04/29/14 1350 04/29/14 1942  WBC 3.3* 2.7*  NEUTROABS 1.0*  --   HGB 11.7* 11.1*  HCT 36.5 33.4*  MCV 94.3 93.8  PLT 217 196   Cardiac Enzymes:  Recent Labs Lab 04/29/14 1942 04/30/14 0020 04/30/14 0646  TROPONINI <0.03 <0.03 <0.03   BNP: Invalid input(s): POCBNP CBG:  Recent Labs Lab 04/29/14 1324  GLUCAP 107*    Recent Results (from the past 240 hour(s))  Culture, blood (routine x 2)     Status: None (Preliminary result)   Collection Time: 04/29/14  1:49 PM  Result Value Ref Range Status   Specimen Description BLOOD RIGHT ANTECUBITAL  Final   Culture   Final           BLOOD CULTURE RECEIVED NO GROWTH TO DATE CULTURE WILL BE HELD FOR 5 DAYS BEFORE ISSUING A FINAL NEGATIVE REPORT Performed at [facility]    Report Status PENDING  Incomplete  Culture, blood (routine x 2)     Status: None (Preliminary result)   Collection Time: 04/29/14  1:50 PM  Result Value Ref Range Status   Specimen Description BLOOD LEFT ANTECUBITAL  Final   Culture   Final           BLOOD CULTURE RECEIVED NO GROWTH TO DATE CULTURE WILL BE HELD FOR 5 DAYS  BEFORE ISSUING A FINAL NEGATIVE REPORT Performed at [facility]    Report Status PENDING  Incomplete     Scheduled Meds: . cefTRIAXone (ROCEPHIN)  IV  1 g Intravenous Q24H  . cholecalciferol  1,000 Units Oral Daily  . heparin  5,000 Units Subcutaneous 3 times per day  . memantine  10 mg Oral Daily

## 2014-04-30 NOTE — Progress Notes (Signed)
BSE completed, full report to follow.  Regular consistency diet placed and pt/daughter educated to recommendations/aspiration precautions. Donavan Burnetamara Shaquitta Burbridge, MS [facility] SLP [phone]

## 2014-05-01 DIAGNOSIS — I480 Paroxysmal atrial fibrillation: Secondary | ICD-10-CM

## 2014-05-01 MED ORDER — HALOPERIDOL LACTATE 5 MG/ML IJ SOLN: 0.5000 mg | Freq: Once | INTRAMUSCULAR | Status: DC

## 2014-05-01 MED ORDER — ENSURE ENLIVE PO LIQD
237.0000 mL | Freq: Two times a day (BID) | ORAL | Status: DC
  Administered 2014-05-01 – 2014-05-02 (×3): 237 mL via ORAL

## 2014-05-01 MED ORDER — NYSTATIN 100000 UNIT/GM EX POWD
Freq: Three times a day (TID) | CUTANEOUS | Status: DC
  Filled 2014-05-01: qty 15

## 2014-05-01 MED ORDER — ASPIRIN 81 MG PO CHEW
81.0000 mg | CHEWABLE_TABLET | Freq: Every day | ORAL | Status: DC
  Administered 2014-05-01 – 2014-05-02 (×2): 81 mg via ORAL
  Filled 2014-05-01 (×2): qty 1

## 2014-05-01 MED ORDER — HALOPERIDOL LACTATE 5 MG/ML IJ SOLN
0.5000 mg | Freq: Once | INTRAMUSCULAR | Status: AC
  Administered 2014-05-01: 0.5 mg via INTRAMUSCULAR
  Filled 2014-05-01: qty 1

## 2014-05-01 NOTE — Progress Notes (Signed)
INITIAL NUTRITION ASSESSMENT  DOCUMENTATION CODES Per approved criteria  -Not Applicable   INTERVENTION: - Continue current diet order - Will order Ensure Enlive po BID, each supplement provides 350 kcal and 20 grams of protein   NUTRITION DIAGNOSIS: Inadequate energy intake related to decreased appetite, dementia with forgetfulness to eat as evidenced by daughter's report.   Goal: Pt to meet >/= 90% estimated needs  Monitor:  Meal and supplement intakes, weight trends, labs, I/O's  Reason for Assessment: Consult  81 y.o. female  Admitting Dx: Acute encephalopathy  ASSESSMENT: Pt is a 81 year old with hx of advanced dementia, gastric ulcer, and arthritis. She was noted to be combative on admission when touched but seems to have calmed down today.  Consult for diet education; defer for full assessment at this time given advanced age. Pt ate 0% of breakfast and 100% of lunch yesterday. Visualized lunch tray with <25% consumed. Pt required frequent redirecting from daughter to eat her lunch. Pt unable to provide information and daughter provides answers to all questions.  Pt with fair appetite PTA which has improved since admission. She does not have chewing/swallowing issues and current diet is per SLP recommendations. She did not drink nutrition supplements PTA. Daughter reports UBW of 150 lbs and that pt gained 5 pounds recently.  Pt variably meeting needs. Labs and medications reviewed. No signs of wasting present during physical exam.  Height: Ht Readings from Last 1 Encounters:  04/29/14 5\' 1"  (1.549 m)    Weight: Wt Readings from Last 1 Encounters:  04/29/14 155 lb (70.308 kg)    Ideal Body Weight: 105 lbs (47.73 kg)  % Ideal Body Weight: 148%  Wt Readings from Last 10 Encounters:  04/29/14 155 lb (70.308 kg)  03/21/13 150 lb (68.04 kg)    Usual Body Weight: 150 lbs (68.04 kg)  % Usual Body Weight: 103%   BMI:  Body mass index is 29.3  kg/(m^2).  Estimated Nutritional Needs: Kcal: 1400-1600 Protein: 60-80 grams Fluid: 2L/day  Skin: WDL  Diet Order: Diet regular Room service appropriate?: Yes with Assist; Fluid consistency:: Thin  EDUCATION NEEDS: -Education not appropriate at this time   Intake/Output Summary (Last 24 hours) at 05/01/14 1400 Last data filed at 05/01/14 0700  Gross per 24 hour  Intake 968.33 ml  Output      0 ml  Net 968.33 ml    Last BM: PTA   Labs:   Recent Labs Lab 04/29/14 1350 04/29/14 1942 04/30/14 0646  NA 138  --  139  K 3.6  --  3.7  CL 105  --  107  CO2 25  --  25  BUN 13  --  10  CREATININE 0.83 0.75 0.75  CALCIUM 9.0  --  8.8  GLUCOSE 107*  --  91    CBG (last 3)   Recent Labs  04/29/14 1324  GLUCAP 107*    Scheduled Meds: . aspirin  81 mg Oral Daily  . cefTRIAXone (ROCEPHIN)  IV  1 g Intravenous Q24H  . cholecalciferol  1,000 Units Oral Daily  . heparin  5,000 Units Subcutaneous 3 times per day  . memantine  10 mg Oral Daily    Continuous Infusions:   Past Medical History  Diagnosis Date  . Dementia   . Gastric ulcer   . Arthritis     Past Surgical History  Procedure Laterality Date  . Hip surgery      rt/left    Trenton GammonJessica Yasseen Salls, RD,  LDN Inpatient Clinical Dietitian Pager # [phone] After hours/weekend pager # [phone]

## 2014-05-01 NOTE — Progress Notes (Signed)
Speech Language Pathology Treatment: Dysphagia  Patient Details Name: Alexis Gregory MRN: [id number] DOB: November 19, 1921 Today's Date: 05/01/2014 Time: 2174-7159 SLP Time Calculation (min) (ACUTE ONLY): 15 min  Assessment / Plan / Recommendation Clinical Impression  Today is this pt's 81th birthday!!!  SlP observed pt consuming sprite - no indications of airway compromise/aspiration noted.  Today pt is verbose and therefore only accepted single bolus of liquid.  Her voice remained clear post swallow and pt without throat clearing noted yesterday.    Daughter present reports good intake and no difficulties.  Daughter states this SLP placed pt's eyeglasses on her yesterday but SLP does not recall this event and informed daughter to said information.  SLP did assist pt to place her upper dentures however (brushed her dentures and assisted hand over hand to place them).     HPI HPI: 81 yo female adm to [facility] with AMS.  Pt PMH + for gastric ulcer.  Pt failed RNSSS and swallow evaluation completed yesterday.  Recommendation for regular/thin diet indicated.  Follow up x1 needed due to pt throat clearing with intake that daughter reports has been present within the last few weeks.     Pertinent Vitals Pain Assessment: No/denies pain  SLP Plan  All goals met    Recommendations Diet recommendations: Regular;Thin liquid Liquids provided via: Straw Medication Administration: Whole meds with liquid Supervision: Patient able to self feed Compensations: Slow rate;Small sips/bites Postural Changes and/or Swallow Maneuvers: Seated upright 90 degrees;Upright 30-60 min after meal              Oral Care Recommendations: Oral care BID Plan: All goals met    [place], [facility] SLP [phone]

## 2014-05-01 NOTE — Progress Notes (Addendum)
[facility] Progress Note   DELIYAH MUCKLE ZOX:[id number] DOB: 1921/06/16 DOA: 04/29/2014 PCP: Gwynneth Aliment, MD  Brief narrative: ROSENDA GEFFRARD is a 81 y.o. female with history of dementia who presented to the hospital with increased confusion and a poor appetite for a few days and was found to have a urinary tract infection, temperature of 96.4 and hypotension BP of 89/69. Daughter also noticed at home that the patient slummped over in the chair and was unresponsive for a short period of time.   Subjective: Slightly agitated this morning-daughter is at bedside and states that she is at baseline.  Assessment/Plan: Principal Problem:   Acute encephalopathy - Suspected secondary to UTI Active Problems:   UTI (lower urinary tract infection) - Continue Rocephin-urine culture growing gram-negative rods    Syncope - Possibly due to hypotension/orthostasis but also noted now that she is in atrial fibrillation and therefore unable to determine if it was related to RVR - Daughter does not want extensive workup  Atrial fibrillation -Noted on EKG performed yesterday and continues to be present on telemetry -Rate is mostly controlled in the 80s  -Have discussed anticoagulation with daughter and she is hesitant to put her on full anticoagulation or do an aggressive workup therefore will not order an ECHO-she is agreeable to aspirin - rate relatively controlled - will hold off on rate controlling agent    Dementia -At baseline at this time   Appt with PCP: Code Status: Full Family Communication: daughter at bedside  Disposition Plan: home when culture results DVT prophylaxis: Heparin Consultants: none Procedures:  Antibiotics: Anti-infectives    Start     Dose/Rate Route Frequency Ordered Stop   04/30/14 1630  cefTRIAXone (ROCEPHIN) 1 g in dextrose 5 % 50 mL IVPB     1 g 100 mL/hr over 30 Minutes Intravenous Every 24 hours 04/29/14 1622     04/29/14 1600   cefTRIAXone (ROCEPHIN) 1 g in dextrose 5 % 50 mL IVPB     1 g 100 mL/hr over 30 Minutes Intravenous  Once 04/29/14 1558 04/29/14 1709      Objective: Filed Weights   04/29/14 1339  Weight: 70.308 kg (155 lb)    Intake/Output Summary (Last 24 hours) at 05/01/14 1725 Last data filed at 05/01/14 1500  Gross per 24 hour  Intake 1328.33 ml  Output      0 ml  Net 1328.33 ml     Vitals Filed Vitals:   04/30/14 0430 04/30/14 1503 04/30/14 2208 05/01/14 1626  BP: 130/86 135/96 164/85 155/74  Pulse: 79 76 76 115  Temp: 97.8 F (36.6 C) 98 F (36.7 C) 98.7 F (37.1 C) 98.2 F (36.8 C)  TempSrc: Oral Oral Axillary Oral  Resp: Height:      Weight:      SpO2: 99% 96% 97% 99%    Exam:  General:  Pt is alert, not in acute distress, mildly restless  HEENT: No icterus, No thrush  Cardiovascular: IIRR, S1/S2 No murmur  Respiratory: clear to auscultation bilaterally   Abdomen: Soft, +Bowel sounds, non tender, non distended, no guarding  MSK: No LE edema, cyanosis or clubbing  Data Reviewed: Basic Metabolic Panel:  Recent Labs Lab 04/29/14 1350 04/29/14 1942 04/30/14 0646  NA 138  --  139  K 3.6  --  3.7  CL 105  --  107  CO2 25  --  25  GLUCOSE 107*  --  91  BUN 13  --  10  CREATININE 0.83 0.75 0.75  CALCIUM 9.0  --  8.8   Liver Function Tests:  Recent Labs Lab 04/29/14 1350 04/30/14 0646  AST 25 25  ALT 13 12  ALKPHOS 119* 114  BILITOT 0.5 0.6  PROT 7.4 7.2  ALBUMIN 3.9 3.5   No results for input(s): LIPASE, AMYLASE in the last 168 hours. No results for input(s): AMMONIA in the last 168 hours. CBC:  Recent Labs Lab 04/29/14 1350 04/29/14 1942  WBC 3.3* 2.7*  NEUTROABS 1.0*  --   HGB 11.7* 11.1*  HCT 36.5 33.4*  MCV 94.3 93.8  PLT 217 196   Cardiac Enzymes:  Recent Labs Lab 04/29/14 1942 04/30/14 0020 04/30/14 0646  TROPONINI <0.03 <0.03 <0.03   BNP (last 3 results) No results for input(s): BNP in the last 8760  hours.  ProBNP (last 3 results) No results for input(s): PROBNP in the last 8760 hours.  CBG:  Recent Labs Lab 04/29/14 1324  GLUCAP 107*    Recent Results (from the past 240 hour(s))  Culture, blood (routine x 2)     Status: None (Preliminary result)   Collection Time: 04/29/14  1:49 PM  Result Value Ref Range Status   Specimen Description BLOOD RIGHT ANTECUBITAL  Final   Special Requests BOTTLES DRAWN AEROBIC AND ANAEROBIC 5CC  Final   Culture   Final           BLOOD CULTURE RECEIVED NO GROWTH TO DATE CULTURE WILL BE HELD FOR 5 DAYS BEFORE ISSUING A FINAL NEGATIVE REPORT Performed at [facility]    Report Status PENDING  Incomplete  Culture, blood (routine x 2)     Status: None (Preliminary result)   Collection Time: 04/29/14  1:50 PM  Result Value Ref Range Status   Specimen Description BLOOD LEFT ANTECUBITAL  Final   Special Requests BOTTLES DRAWN AEROBIC AND ANAEROBIC 5CC  Final   Culture   Final           BLOOD CULTURE RECEIVED NO GROWTH TO DATE CULTURE WILL BE HELD FOR 5 DAYS BEFORE ISSUING A FINAL NEGATIVE REPORT Performed at [facility]    Report Status PENDING  Incomplete  Urine culture     Status: None (Preliminary result)   Collection Time: 04/29/14  4:18 PM  Result Value Ref Range Status   Specimen Description URINE, CLEAN CATCH  Final   Special Requests Normal  Final   Colony Count   Final    >=100,000 COLONIES/ML Performed at [facility]    Culture   Final    GRAM NEGATIVE RODS Performed at [facility]    Report Status PENDING  Incomplete     Studies:  Recent x-ray studies have been reviewed in detail by the Attending Physician  Scheduled Meds:  Scheduled Meds: . aspirin  81 mg Oral Daily  . cefTRIAXone (ROCEPHIN)  IV  1 g Intravenous Q24H  . cholecalciferol  1,000 Units Oral Daily  . feeding supplement (ENSURE ENLIVE)  237 mL Oral BID BM  . heparin  5,000 Units Subcutaneous 3 times per day  . memantine   10 mg Oral Daily   Continuous Infusions:   Time spent on care of this patient: 35 min   Elisheba Mcdonnell, MD 05/01/2014, 5:25 PM  LOS: 2 days   [facility] Office  [phone] Pager - Text Page per [email or web address]  If 7PM-7AM, please contact night-coverage [email or web address]

## 2014-05-02 LAB — URINE CULTURE
Colony Count: >=100000
Special Requests: NORMAL

## 2014-05-02 MED ORDER — ASPIRIN 81 MG PO CHEW: 81.0000 mg | CHEWABLE_TABLET | Freq: Every day | ORAL | Status: DC

## 2014-05-02 NOTE — Progress Notes (Signed)
05/02/14 1555  Reviewed discharge instructions with patient and daughter. Daughter verbalized understanding of discharge instructions. Copy of discharge instructions given to daughter.

## 2014-05-02 NOTE — Discharge Summary (Signed)
Physician Discharge Summary  Alexis NovemberJuanita G Gregory ZOX:[id number] DOB: 11-Nov-1921 DOA: 04/29/2014  PCP: Gwynneth AlimentSANDERS,ROBYN N, MD  Admit date: 04/29/2014 Discharge date: 05/02/2014  Time spent: 50 minutes    Discharge Condition: Stable Diet recommendation: Low sodium heart healthy  Discharge Diagnoses:  Principal Problem:   Acute encephalopathy Active Problems:   Syncope   UTI (lower urinary tract infection)   PAF (paroxysmal atrial fibrillation)- new diagnosis   Dementia  History of present illness:  Alexis NovemberJuanita G Gregory is a 81 y.o. female with history of dementia who presented to the hospital with loss of consciousness. Daughter noticed at home that the patient slumped over in the chair and was unresponsive for a short period of time. She had also been noticing increased confusion and a poor appetite for a few days. In the ER, the patient was found to have a urinary tract infection, temperature of 96.4 and hypotension BP of 89/69.  Please see history of present illness from 04/29/14 for further details of admission  Hospital Course:  Principal Problem:  Syncope - Possibly due to hypotension/orthostasis but also noted now that she is in paroxysmal atrial fibrillation and therefore unable to determine if syncope was related to RVR - Daughter does not want extensive workup   Acute encephalopathy - Suspected secondary to UTI and resolved-daughter admits that patient is back to baseline   UTI (lower urinary tract infection) - Has been given 3 days of Rocephin Rocephin-urine culture growing greater than 100,000 colonies of Proteus sensitive to Rocephin-has completed treatment  Atrial fibrillation- paroxysmal -Noted on EKG performed on 4/19-she was in sinus rhythm on the day of admission -Rate is mostly controlled in the 80s  -Have discussed anticoagulation with daughter and explain the risk of strokes- she is hesitant to put her on full anticoagulation or do an aggressive workup therefore  will not order an ECHO-she is agreeable to aspirin -As rate is relatively controlled, will hold off on rate controlling agent   Dementia -At baseline at this time  Chronic leukopenia   Discharge Exam: Filed Weights   04/29/14 1339  Weight: 70.308 kg (155 lb)   Filed Vitals:   05/02/14 1100  BP: 139/76  Pulse: 96  Temp:   Resp: 20    General: AAO x 3, no distress, nonverbal Cardiovascular: IIRR, no murmurs  Respiratory: clear to auscultation bilaterally GI: soft, non-tender, non-distended, bowel sound positive  Discharge Instructions You were cared for by a hospitalist during your hospital stay. If you have any questions about your discharge medications or the care you received while you were in the hospital after you are discharged, you can call the unit and asked to speak with the hospitalist on call if the hospitalist that took care of you is not available. Once you are discharged, your primary care physician will handle any further medical issues. Please note that NO REFILLS for any discharge medications will be authorized once you are discharged, as it is imperative that you return to your primary care physician (or establish a relationship with a primary care physician if you do not have one) for your aftercare needs so that they can reassess your need for medications and monitor your lab values.      Discharge Instructions    Diet - low sodium heart healthy    Complete by:  As directed      Increase activity slowly    Complete by:  As directed  Medication List    TAKE these medications        acetaminophen 650 MG CR tablet  Commonly known as:  TYLENOL  Take 650 mg by mouth daily as needed for pain.     aspirin 81 MG chewable tablet  Chew 1 tablet (81 mg total) by mouth daily.     cholecalciferol 1000 UNITS tablet  Commonly known as:  VITAMIN D  Take 1,000 Units by mouth daily.     memantine 10 MG tablet  Commonly known as:  NAMENDA  Take 10  mg by mouth daily.     traMADol 50 MG tablet  Commonly known as:  ULTRAM  Take 50 mg by mouth daily as needed for moderate pain.       No Known Allergies    The results of significant diagnostics from this hospitalization (including imaging, microbiology, ancillary and laboratory) are listed below for reference.    Significant Diagnostic Studies: Dg Chest Port 1 View  04/29/2014   CLINICAL DATA:  Shortness of breath, altered mental status.  EXAM: PORTABLE CHEST - 1 VIEW  COMPARISON:  Gregory 1, 2012.  FINDINGS: Stable cardiomediastinal silhouette. Hypoinflation of the lungs is noted. No pneumothorax or significant pleural effusion is noted. Degenerative change of the right glenohumeral joint is noted. Mild left basilar subsegmental atelectasis is noted. No significant abnormality seen in the right lung.  IMPRESSION: Hypoinflation of the lungs is noted. Mild left basilar subsegmental atelectasis is noted.   Electronically Signed   By: Lupita Raider, M.D.   On: 04/29/2014 14:24    Microbiology: Recent Results (from the past 240 hour(s))  Culture, blood (routine x 2)     Status: None (Preliminary result)   Collection Time: 04/29/14  1:49 PM  Result Value Ref Range Status   Specimen Description BLOOD RIGHT ANTECUBITAL  Final   Special Requests BOTTLES DRAWN AEROBIC AND ANAEROBIC 5CC  Final   Culture   Final           BLOOD CULTURE RECEIVED NO GROWTH TO DATE CULTURE WILL BE HELD FOR 5 DAYS BEFORE ISSUING A FINAL NEGATIVE REPORT Performed at [facility]    Report Status PENDING  Incomplete  Culture, blood (routine x 2)     Status: None (Preliminary result)   Collection Time: 04/29/14  1:50 PM  Result Value Ref Range Status   Specimen Description BLOOD LEFT ANTECUBITAL  Final   Special Requests BOTTLES DRAWN AEROBIC AND ANAEROBIC 5CC  Final   Culture   Final           BLOOD CULTURE RECEIVED NO GROWTH TO DATE CULTURE WILL BE HELD FOR 5 DAYS BEFORE ISSUING A FINAL NEGATIVE  REPORT Performed at [facility]    Report Status PENDING  Incomplete  Urine culture     Status: None   Collection Time: 04/29/14  4:18 PM  Result Value Ref Range Status   Specimen Description URINE, CLEAN CATCH  Final   Special Requests Normal  Final   Colony Count   Final    >=100,000 COLONIES/ML Performed at [facility]    Culture   Final    PROTEUS MIRABILIS Performed at [facility]    Report Status 05/02/2014 FINAL  Final   Organism ID, Bacteria PROTEUS MIRABILIS  Final      Susceptibility   Proteus mirabilis - MIC*    AMPICILLIN <=2 SENSITIVE Sensitive     CEFAZOLIN <=4 SENSITIVE Sensitive     CEFTRIAXONE <=  1 SENSITIVE Sensitive     CIPROFLOXACIN <=0.25 SENSITIVE Sensitive     GENTAMICIN 4 SENSITIVE Sensitive     LEVOFLOXACIN <=0.12 SENSITIVE Sensitive     NITROFURANTOIN 128 RESISTANT Resistant     TOBRAMYCIN 2 SENSITIVE Sensitive     TRIMETH/SULFA 160 RESISTANT Resistant     PIP/TAZO <=4 SENSITIVE Sensitive     * PROTEUS MIRABILIS     Labs: Basic Metabolic Panel:  Recent Labs Lab 04/29/14 1350 04/29/14 1942 04/30/14 0646  NA 138  --  139  K 3.6  --  3.7  CL 105  --  107  CO2 25  --  25  GLUCOSE 107*  --  91  BUN 13  --  10  CREATININE 0.83 0.75 0.75  CALCIUM 9.0  --  8.8   Liver Function Tests:  Recent Labs Lab 04/29/14 1350 04/30/14 0646  AST 25 25  ALT 13 12  ALKPHOS 119* 114  BILITOT 0.5 0.6  PROT 7.4 7.2  ALBUMIN 3.9 3.5   No results for input(s): LIPASE, AMYLASE in the last 168 hours. No results for input(s): AMMONIA in the last 168 hours. CBC:  Recent Labs Lab 04/29/14 1350 04/29/14 1942  WBC 3.3* 2.7*  NEUTROABS 1.0*  --   HGB 11.7* 11.1*  HCT 36.5 33.4*  MCV 94.3 93.8  PLT 217 196   Cardiac Enzymes:  Recent Labs Lab 04/29/14 1942 04/30/14 0020 04/30/14 0646  TROPONINI <0.03 <0.03 <0.03   BNP: BNP (last 3 results) No results for input(s): BNP in the last 8760 hours.  ProBNP (last 3  results) No results for input(s): PROBNP in the last 8760 hours.  CBG:  Recent Labs Lab 04/29/14 1324  GLUCAP 107*       SignedCalvert Cantor, MD [facility] 05/02/2014, 1:16 PM

## 2014-05-02 NOTE — Progress Notes (Signed)
05/02/14 1600 Spoke with patient's daughter regarding physical therapy recommending 24 hours supervision.  Daughter states patient will be returning home and that she is able to provide the 24 hour supervision.

## 2014-05-05 LAB — CULTURE, BLOOD (ROUTINE X 2)
CULTURE: NO GROWTH
Culture: NO GROWTH

## 2014-09-18 ENCOUNTER — Other Ambulatory Visit: Payer: Self-pay | Admitting: *Deleted

## 2014-10-21 ENCOUNTER — Telehealth: Payer: Self-pay | Admitting: *Deleted

## 2014-10-21 ENCOUNTER — Encounter: Payer: Medicare Other | Admitting: Podiatry

## 2014-10-21 NOTE — Telephone Encounter (Signed)
Pt's dtr, Rosey Bath states pt has dementia and is fighting her and she can not get her to today's appt, please call to reschedule.

## 2014-10-29 ENCOUNTER — Ambulatory Visit: Payer: Self-pay | Admitting: Neurology

## 2014-10-29 ENCOUNTER — Telehealth: Payer: Self-pay | Admitting: *Deleted

## 2014-10-29 ENCOUNTER — Ambulatory Visit: Payer: Medicare Other | Admitting: Neurology

## 2014-10-29 NOTE — Telephone Encounter (Signed)
Patient's daughter called - unable to get her mother to her appt today.

## 2014-10-30 ENCOUNTER — Encounter: Payer: Self-pay | Admitting: Neurology

## 2014-12-10 ENCOUNTER — Encounter: Payer: Self-pay | Admitting: Podiatry

## 2014-12-10 ENCOUNTER — Ambulatory Visit (INDEPENDENT_AMBULATORY_CARE_PROVIDER_SITE_OTHER): Payer: Medicare Other | Admitting: Podiatry

## 2014-12-10 DIAGNOSIS — B351 Tinea unguium: Secondary | ICD-10-CM

## 2014-12-10 DIAGNOSIS — M79676 Pain in unspecified toe(s): Secondary | ICD-10-CM | POA: Diagnosis not present

## 2014-12-10 NOTE — Progress Notes (Signed)
Patient ID: Alexis Gregory, female   DOB: [birth year], 81 y.o.   MRN: [id number]  Subjective: 81 y.o. returns the office today for painful, elongated, thickened toenails which they cannot trim themself. Denies any redness or drainage around the nails. Denies any acute changes since last appointment and no new complaints today. Denies any systemic complaints such as fevers, chills, nausea, vomiting. Presents with caregiver.  Objective:  NAD DP/PT pulses palpable but decreased, CRT less than 3 seconds  Nails hypertrophic, dystrophic, elongated, brittle, discolored 10. There is tenderness overlying the nails 1-5 bilaterally. There is no surrounding erythema or drainage along the nail sites. No open lesions or pre-ulcerative lesions are identified. No other areas of tenderness bilateral lower extremities. No overlying edema, erythema, increased warmth. No pain with calf compression, swelling, warmth, erythema.  Assessment: Patient presents with symptomatic onychomycosis  Plan: -Treatment options including alternatives, risks, complications were discussed -Nails sharply debrided 10 without complication/bleeding. -Discussed daily foot inspection. If there are any changes, to call the office immediately.  -Follow-up in 3 months or sooner if any problems are to arise. In the meantime, encouraged to call the office with any questions, concerns, changes symptoms.  Ovid CurdMatthew Vonceil Upshur, DPM

## 2015-03-18 ENCOUNTER — Ambulatory Visit: Payer: Medicare Other | Admitting: Podiatry

## 2015-03-21 ENCOUNTER — Ambulatory Visit: Payer: Medicare Other | Admitting: Podiatry

## 2015-05-07 ENCOUNTER — Emergency Department (HOSPITAL_COMMUNITY)
Admission: EM | Admit: 2015-05-07 | Discharge: 2015-05-07 | Disposition: A | Discharge status: Home / Self Care | Payer: Medicare Other | Attending: Emergency Medicine | Admitting: Emergency Medicine

## 2015-05-07 ENCOUNTER — Encounter (HOSPITAL_COMMUNITY): Payer: Self-pay | Admitting: *Deleted

## 2015-05-07 DIAGNOSIS — K5900 Constipation, unspecified: Secondary | ICD-10-CM | POA: Diagnosis not present

## 2015-05-07 DIAGNOSIS — M545 Low back pain: Secondary | ICD-10-CM | POA: Diagnosis not present

## 2015-05-07 DIAGNOSIS — F0390 Unspecified dementia without behavioral disturbance: Secondary | ICD-10-CM | POA: Diagnosis not present

## 2015-05-07 HISTORY — DX: Unspecified atrial fibrillation: I48.91

## 2015-05-07 NOTE — ED Notes (Signed)
Pt placed on BSC with assistance d/t pt having a BM.  Daughter stated "if I had known this, I wouldn't have called EMS".

## 2015-05-07 NOTE — ED Notes (Signed)
Per [facility], pt from home, ambulates with a walker, lives with daugher.  [place], no confirmed BM since.  Pt has c/o LBP.  Has hx of controlled A-fib running between 70-100.  12 lead unremarkable.

## 2015-05-07 NOTE — ED Notes (Signed)
Bed: WA03 Expected date:  Expected time:  Means of arrival:  Comments: EMS 82 yo female from home/lower back pain/IV established MP atrial fib

## 2015-05-07 NOTE — ED Notes (Signed)
Pt has a huge BM and stated that she feels better

## 2015-05-25 ENCOUNTER — Encounter (HOSPITAL_COMMUNITY): Payer: Self-pay | Admitting: Emergency Medicine

## 2015-05-25 ENCOUNTER — Emergency Department (HOSPITAL_COMMUNITY): Payer: Medicare Other

## 2015-05-25 ENCOUNTER — Emergency Department (HOSPITAL_COMMUNITY)
Admission: EM | Admit: 2015-05-25 | Discharge: 2015-05-25 | Disposition: A | Discharge status: Home / Self Care | Payer: Medicare Other | Attending: Emergency Medicine | Admitting: Emergency Medicine

## 2015-05-25 DIAGNOSIS — Z79899 Other long term (current) drug therapy: Secondary | ICD-10-CM | POA: Insufficient documentation

## 2015-05-25 DIAGNOSIS — I4891 Unspecified atrial fibrillation: Secondary | ICD-10-CM | POA: Insufficient documentation

## 2015-05-25 DIAGNOSIS — Z7982 Long term (current) use of aspirin: Secondary | ICD-10-CM | POA: Insufficient documentation

## 2015-05-25 DIAGNOSIS — F0390 Unspecified dementia without behavioral disturbance: Secondary | ICD-10-CM | POA: Insufficient documentation

## 2015-05-25 DIAGNOSIS — R197 Diarrhea, unspecified: Secondary | ICD-10-CM | POA: Diagnosis present

## 2015-05-25 DIAGNOSIS — M1990 Unspecified osteoarthritis, unspecified site: Secondary | ICD-10-CM | POA: Diagnosis not present

## 2015-05-25 DIAGNOSIS — R630 Anorexia: Secondary | ICD-10-CM | POA: Insufficient documentation

## 2015-05-25 DIAGNOSIS — R569 Unspecified convulsions: Secondary | ICD-10-CM | POA: Insufficient documentation

## 2015-05-25 DIAGNOSIS — Z8719 Personal history of other diseases of the digestive system: Secondary | ICD-10-CM | POA: Insufficient documentation

## 2015-05-25 DIAGNOSIS — R410 Disorientation, unspecified: Secondary | ICD-10-CM | POA: Diagnosis not present

## 2015-05-25 DIAGNOSIS — Z87891 Personal history of nicotine dependence: Secondary | ICD-10-CM | POA: Diagnosis not present

## 2015-05-25 DIAGNOSIS — E860 Dehydration: Secondary | ICD-10-CM | POA: Diagnosis not present

## 2015-05-25 LAB — URINE MICROSCOPIC-ADD ON

## 2015-05-25 LAB — CBC WITH DIFFERENTIAL/PLATELET
BASOS ABS: 0 10*3/uL (ref 0.0–0.1)
BASOS PCT: 0 %
EOS ABS: 0 10*3/uL (ref 0.0–0.7)
EOS PCT: 1 %
HCT: 40.9 % (ref 36.0–46.0)
HEMOGLOBIN: 13.1 g/dL (ref 12.0–15.0)
Lymphocytes Relative: 29 %
Lymphs Abs: 1.4 10*3/uL (ref 0.7–4.0)
MCH: 31 pg (ref 26.0–34.0)
MCHC: 32 g/dL (ref 30.0–36.0)
MCV: 96.7 fL (ref 78.0–100.0)
Monocytes Absolute: 0.3 10*3/uL (ref 0.1–1.0)
Monocytes Relative: 6 %
NEUTROS PCT: 64 %
Neutro Abs: 3.3 10*3/uL (ref 1.7–7.7)
PLATELETS: 245 10*3/uL (ref 150–400)
RBC: 4.23 MIL/uL (ref 3.87–5.11)
RDW: 13.4 % (ref 11.5–15.5)
WBC: 5 10*3/uL (ref 4.0–10.5)

## 2015-05-25 LAB — COMPREHENSIVE METABOLIC PANEL
ALBUMIN: 3.6 g/dL (ref 3.5–5.0)
ALK PHOS: 85 U/L (ref 38–126)
ALT: 12 U/L — AB (ref 14–54)
AST: 27 U/L (ref 15–41)
Anion gap: 12 (ref 5–15)
BUN: 13 mg/dL (ref 6–20)
CALCIUM: 9.7 mg/dL (ref 8.9–10.3)
CO2: 24 mmol/L (ref 22–32)
CREATININE: 1.11 mg/dL — AB (ref 0.44–1.00)
Chloride: 108 mmol/L (ref 101–111)
GFR calc Af Amer: 48 mL/min — ABNORMAL LOW (ref >60)
GFR calc non Af Amer: 42 mL/min — ABNORMAL LOW (ref >60)
GLUCOSE: 137 mg/dL — AB (ref 65–99)
Potassium: 3.4 mmol/L — ABNORMAL LOW (ref 3.5–5.1)
SODIUM: 144 mmol/L (ref 135–145)
Total Bilirubin: 0.9 mg/dL (ref 0.3–1.2)
Total Protein: 7.9 g/dL (ref 6.5–8.1)

## 2015-05-25 LAB — URINALYSIS, ROUTINE W REFLEX MICROSCOPIC
GLUCOSE, UA: NEGATIVE mg/dL
KETONES UR: NEGATIVE mg/dL
Leukocytes, UA: NEGATIVE
Nitrite: NEGATIVE
PH: 5.5 (ref 5.0–8.0)
Protein, ur: 100 mg/dL — AB
Specific Gravity, Urine: 1.028 (ref 1.005–1.030)

## 2015-05-25 LAB — RAPID URINE DRUG SCREEN, HOSP PERFORMED
Amphetamines: NOT DETECTED
BARBITURATES: NOT DETECTED
BENZODIAZEPINES: NOT DETECTED
Cocaine: NOT DETECTED
Opiates: NOT DETECTED
TETRAHYDROCANNABINOL: NOT DETECTED

## 2015-05-25 LAB — ETHANOL

## 2015-05-25 LAB — CBG MONITORING, ED: GLUCOSE-CAPILLARY: 118 mg/dL — AB (ref 65–99)

## 2015-05-25 LAB — AMMONIA: Ammonia: 22 umol/L (ref 9–35)

## 2015-05-25 MED ORDER — SODIUM CHLORIDE 0.9 % IV SOLN
1000.0000 mL | Freq: Once | INTRAVENOUS | Status: AC
  Administered 2015-05-25: 1000 mL via INTRAVENOUS

## 2015-05-25 NOTE — ED Notes (Signed)
Pts daughter called by this RN to let her know that the pt is up for discharge. Daughter to come to hospital to pick up pt.

## 2015-05-25 NOTE — ED Provider Notes (Signed)
CSN: [id number]     Arrival date & time 05/25/15  1630 History   First MD Initiated Contact with Patient 05/25/15 1636     Chief Complaint  Patient presents with  . Altered Mental Status     (Consider location/radiation/quality/duration/timing/severity/associated sxs/prior Treatment) The history is provided by the patient, the EMS personnel and medical records. The history is limited by the condition of the patient (patient with baseline dementia).    Patient is a 82 year old female with PMh dementia, atrial fibrillation, who presents via EMS from home after 4 days of worsening confusion. Patient has been refusing to eat or drink for the past 4 days per family. Today she has also had several episodes of nonbloody watery diarrhea. Denies vomiting, abdominal pain, chest pain or shortness of breath.  Level V caveat for patient with dementia at baseline and unable/unwilling to answer most questions.   Past Medical History  Diagnosis Date  . Dementia   . Gastric ulcer   . Arthritis   . Atrial fibrillation [facility](HCC)    Past Surgical History  Procedure Laterality Date  . Hip surgery      rt/left   No family history on file. Social History  Substance Use Topics  . Smoking status: Former Games developermoker  . Smokeless tobacco: None  . Alcohol Use: No   OB History    No data available    ROS limited secondary to patient dementia and unable to answer questions  Review of Systems  Constitutional: Positive for appetite change. Negative for fever.  Gastrointestinal: Positive for diarrhea.  Neurological: Positive for seizures.  Psychiatric/Behavioral: Positive for confusion.   Allergies  Review of patient's allergies indicates no known allergies.  Home Medications   Prior to Admission medications   Medication Sig Start Date End Date Taking? Authorizing Provider  acetaminophen (TYLENOL) 650 MG CR tablet Take 325-650 mg by mouth daily as needed for pain.    Yes Historical Provider, MD   aspirin 81 MG chewable tablet Chew 1 tablet (81 mg total) by mouth daily. 05/02/14  Yes Calvert CantorSaima Rizwan, MD  cholecalciferol (VITAMIN D) 1000 UNITS tablet Take 1,000 Units by mouth daily.   Yes Historical Provider, MD  memantine (NAMENDA) 10 MG tablet Take 10 mg by mouth daily. 03/08/14  Yes Historical Provider, MD  traMADol (ULTRAM) 50 MG tablet Take 50 mg by mouth daily as needed for moderate pain.  03/26/14  Yes Historical Provider, MD   BP 145/91 mmHg  Pulse 86  Temp(Src) 98.4 F (36.9 C) (Oral)  Resp 21  Wt 70.308 kg  SpO2 97% Physical Exam  Constitutional: She appears well-developed and well-nourished. No distress.  HENT:  Head: Normocephalic and atraumatic.  Eyes: EOM are normal. Pupils are equal, round, and reactive to light.  Neck: Normal range of motion. Neck supple.  Cardiovascular: Normal rate and intact distal pulses.  An irregularly irregular rhythm present.  Pulmonary/Chest: Effort normal and breath sounds normal. No respiratory distress.  Abdominal: Soft. She exhibits no distension. There is no tenderness.  Musculoskeletal: Normal range of motion. She exhibits no edema or tenderness.  Neurological: She is alert.  Laughs inappropriately. No oriented to place, date/time. Does not follow all commands but neurologic exam is grossly intact (moves all extremities, face symmetric)  Skin: Skin is warm and dry. No rash noted.  Psychiatric: She has a normal mood and affect.  Nursing note and vitals reviewed.   ED Course  Procedures (including critical care time) Labs Review Labs Reviewed  COMPREHENSIVE METABOLIC  PANEL - Abnormal; Notable for the following:    Potassium 3.4 (*)    Glucose, Bld 137 (*)    Creatinine, Ser 1.11 (*)    ALT 12 (*)    GFR calc non Af Amer 42 (*)    GFR calc Af Amer 48 (*)    All other components within normal limits  URINALYSIS, ROUTINE W REFLEX MICROSCOPIC (NOT AT [facility]) - Abnormal; Notable for the following:    Color, Urine AMBER (*)    Hgb  urine dipstick SMALL (*)    Bilirubin Urine SMALL (*)    Protein, ur 100 (*)    All other components within normal limits  URINE MICROSCOPIC-ADD ON - Abnormal; Notable for the following:    Squamous Epithelial / LPF 0-5 (*)    Bacteria, UA FEW (*)    Crystals CA OXALATE CRYSTALS (*)    All other components within normal limits  CBG MONITORING, ED - Abnormal; Notable for the following:    Glucose-Capillary 118 (*)    All other components within normal limits  CBC WITH DIFFERENTIAL/PLATELET  AMMONIA  URINE RAPID DRUG SCREEN, HOSP PERFORMED  ETHANOL    Imaging Review Dg Chest 2 View  05/25/2015  CLINICAL DATA:  Altered mental status EXAM: CHEST  2 VIEW COMPARISON:  04/29/2014 FINDINGS: Cardiac shadow is stable. The lungs are well aerated bilaterally. Minimal scarring is again seen in the left base. Degenerative changes of the thoracic spine are seen. No acute bony abnormality is noted. IMPRESSION: No acute abnormality noted. Electronically Signed   By: Alcide Clever M.D.   On: 05/25/2015 18:10   Ct Head Wo Contrast  05/25/2015  CLINICAL DATA:  Altered mental status EXAM: CT HEAD WITHOUT CONTRAST TECHNIQUE: Contiguous axial images were obtained from the base of the skull through the vertex without intravenous contrast. COMPARISON:  None. FINDINGS: Moderate to advanced atrophy. Mild chronic microvascular ischemic change in the white matter. Negative for acute infarct. Negative for acute hemorrhage or mass. No shift of the midline structures. Negative calvarium. IMPRESSION: Atrophy and chronic ischemia.  No acute abnormality Electronically Signed   By: Marlan Palau M.D.   On: 05/25/2015 17:19   I have personally reviewed and evaluated these images and lab results as part of my medical decision-making.   EKG Interpretation   Date/Time:  Sunday May 25 2015 17:30:43 EDT Ventricular Rate:  90 PR Interval:    QRS Duration: 89 QT Interval:  439 QTC Calculation: 537 R Axis:   -9 Text  Interpretation:  Atrial fibrillation Borderline T abnormalities,  anterior leads Prolonged QT interval Confirmed by Jodi Mourning MD, Ivin Booty  [phone]) on 05/25/2015 5:37:01 PM      MDM   Final diagnoses:  Dehydration  Diarrhea, unspecified type    Patient is a 82 year old female who presents from home with concern for altered mental status, diarrhea and poor by mouth intake. Dentition she is afebrile in rate controlled atrial fibrillation with otherwise stable stable vital signs. Physical exam is unremarkable. Patient is unable to answer questions or follow all commands on exam does not gross focal neurologic deficits. She is pleasant and well-appearing. CBG glucose is normal. Labs significant only for mildly elevated creatinine compared with baseline at 1.1. Urinary analysis unremarkable. UDS is negative. Ammonia level is negative. EKG shows rate controlled A. fib. Chest x-ray does not show any acute signs of infection or other pathology.  CT head does not show any acute signs of stroke or ischemia. Patient treated with IV  fluids. She is also tolerating by mouth and drink 2 cups of apple use. She remains clinically stable on multiple re-evaluations. Doubt neurologic, infectious, or toxic ingestions as cause of patient's symptome. Symptoms are likely due to progression of her dementia and do not appear to be acute. Patient was discharged in stable condition. Will follow-up with her primary doctor in the next 2 days for reevaluation. Strict return precautions were given.  Discussed with Dr. Jodi Mourning, ED attending   Isa Rankin, MD 05/26/15 1610  Blane Ohara, MD 05/26/15 [phone]

## 2015-05-25 NOTE — ED Notes (Signed)
Pt's daughter st's she is going home and wants to be called if pt is discharged [phone]

## 2015-05-25 NOTE — ED Notes (Signed)
Pt to ED via [facility] with reports of altered mental status x's 4 days.  Daughter reported to EMS that pt has been refusing to eat or drink for past 4 days.  Also hallucinating.  Pt denies any complaints.

## 2015-05-25 NOTE — ED Notes (Signed)
Daughter here to pick up pt,

## 2015-06-05 IMAGING — CR DG CHEST 1V PORT
1 series · 1 of 1 positions shown · non-contrast
Comparison: November 12, 2010.

CLINICAL DATA: Shortness of breath, altered mental status.

EXAM:
PORTABLE CHEST - 1 VIEW

[AP]
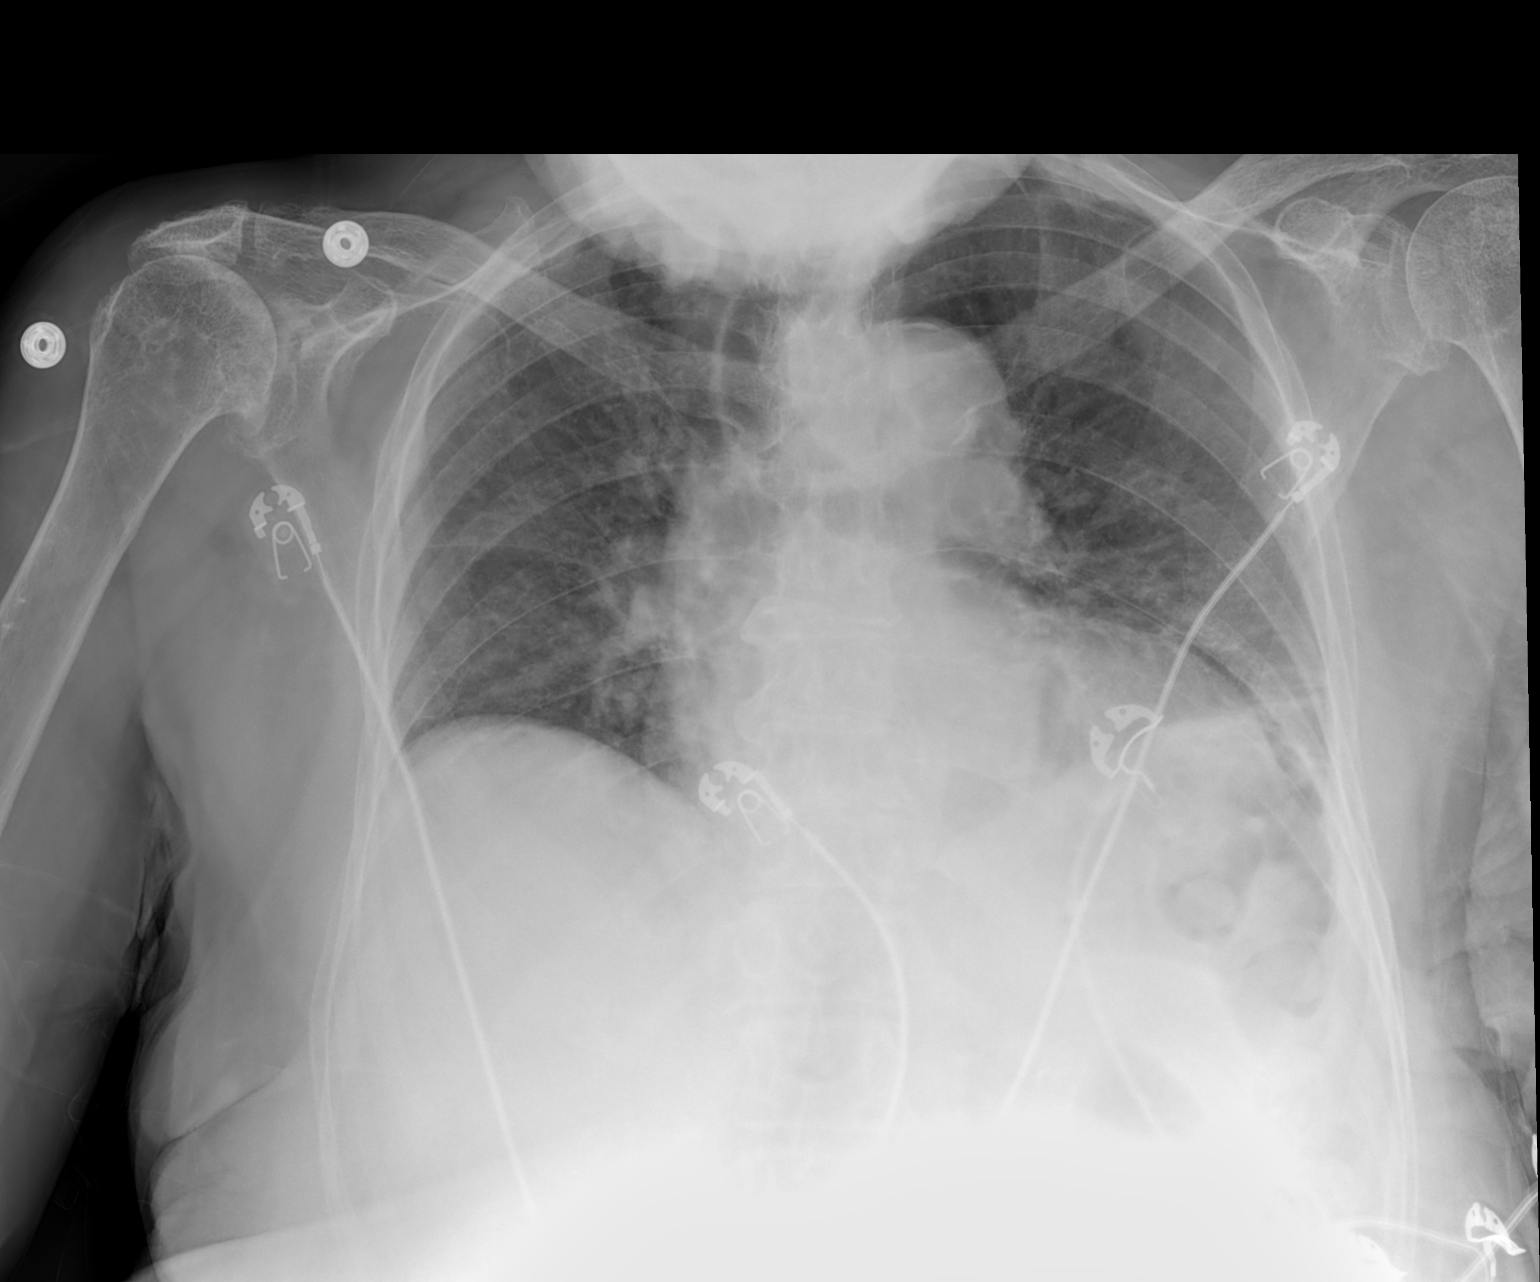

[1 of 1 positions shown; findings below may reference images not displayed]

FINDINGS: Stable cardiomediastinal silhouette. Hypoinflation of the lungs is
noted. No pneumothorax or significant pleural effusion is noted.
Degenerative change of the right glenohumeral joint is noted. Mild
left basilar subsegmental atelectasis is noted. No significant
abnormality seen in the right lung.
IMPRESSION: Hypoinflation of the lungs is noted. Mild left basilar subsegmental
atelectasis is noted.

## 2015-08-12 DEATH — deceased

## 2015-11-04 ENCOUNTER — Other Ambulatory Visit: Payer: Self-pay | Admitting: *Deleted

## 2016-06-21 ENCOUNTER — Telehealth: Payer: Self-pay

## 2016-06-21 NOTE — Telephone Encounter (Signed)
Spoke with pt by phone regarding today's lab results.  Pt instructed she can start her next 21 day cycle of Ibrance today since ANC has come up to 1.7.  Pt verbalizes understanding and knows to call for any questions or concerns.

## 2016-06-30 IMAGING — DX DG CHEST 2V
2 series · 2 of 2 positions shown · non-contrast
Comparison: 04/29/2014

CLINICAL DATA: Altered mental status

EXAM:
CHEST  2 VIEW

[chest lat]
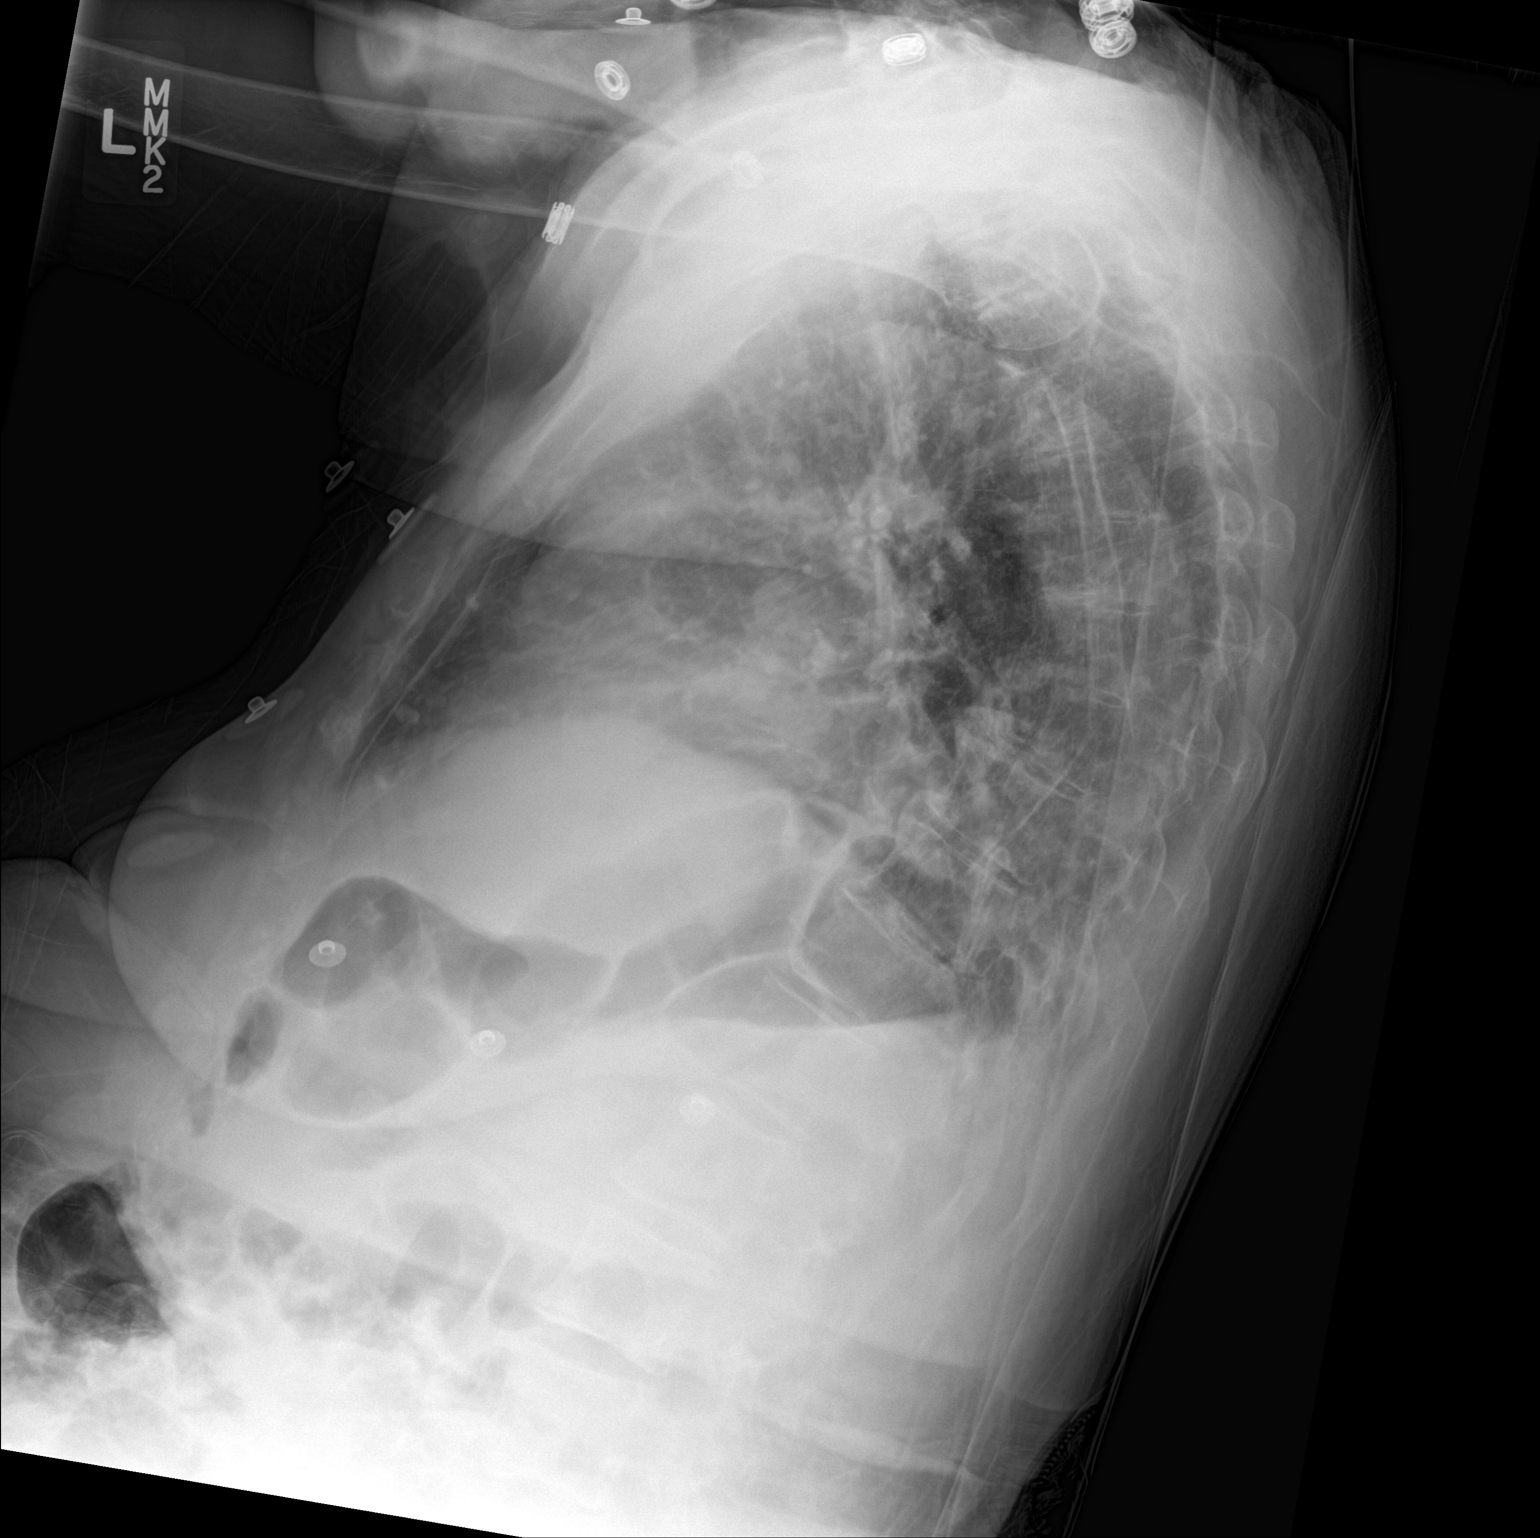

[chest ap]
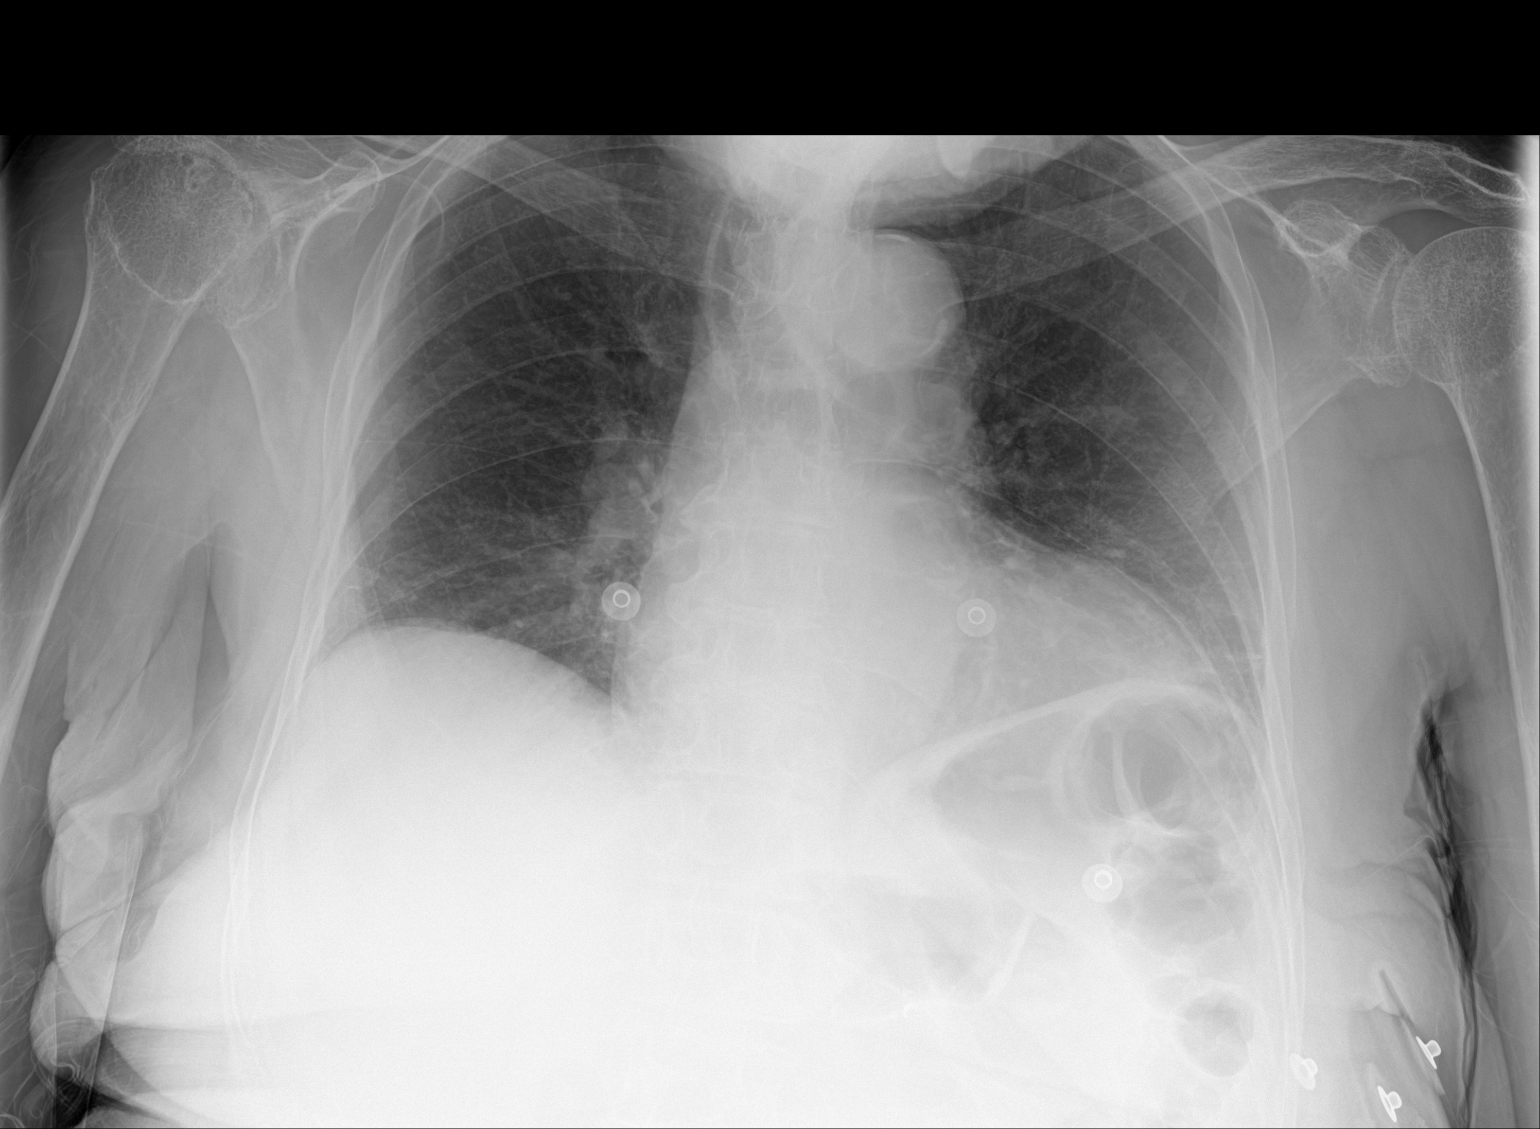

[2 of 2 positions shown; findings below may reference images not displayed]

FINDINGS: Cardiac shadow is stable. The lungs are well aerated bilaterally.
Minimal scarring is again seen in the left base. Degenerative
changes of the thoracic spine are seen. No acute bony abnormality is
noted.
IMPRESSION: No acute abnormality noted.

## 2017-01-20 ENCOUNTER — Other Ambulatory Visit: Payer: Self-pay | Admitting: *Deleted

## 2017-12-20 ENCOUNTER — Other Ambulatory Visit: Payer: Self-pay | Admitting: *Deleted

## 2019-11-21 ENCOUNTER — Other Ambulatory Visit: Payer: Self-pay | Admitting: Oncology
